# Patient Record
Sex: Female | Born: 1940 | Race: White | Hispanic: No | Smoking: Never smoker
Health system: Southern US, Community
[De-identification: ages and names within clinical notes are randomized; demographics above are authoritative.]

## PROBLEM LIST (undated history)

## (undated) DIAGNOSIS — K219 Gastro-esophageal reflux disease without esophagitis: Secondary | ICD-10-CM

## (undated) DIAGNOSIS — I499 Cardiac arrhythmia, unspecified: Secondary | ICD-10-CM

## (undated) DIAGNOSIS — IMO0002 Reserved for concepts with insufficient information to code with codable children: Secondary | ICD-10-CM

## (undated) DIAGNOSIS — I10 Essential (primary) hypertension: Secondary | ICD-10-CM

## (undated) DIAGNOSIS — D72819 Decreased white blood cell count, unspecified: Secondary | ICD-10-CM

## (undated) DIAGNOSIS — I82402 Acute embolism and thrombosis of unspecified deep veins of left lower extremity: Secondary | ICD-10-CM

## (undated) DIAGNOSIS — M1990 Unspecified osteoarthritis, unspecified site: Secondary | ICD-10-CM

## (undated) DIAGNOSIS — D649 Anemia, unspecified: Secondary | ICD-10-CM

## (undated) DIAGNOSIS — E7800 Pure hypercholesterolemia, unspecified: Secondary | ICD-10-CM

## (undated) DIAGNOSIS — J189 Pneumonia, unspecified organism: Secondary | ICD-10-CM

## (undated) DIAGNOSIS — C55 Malignant neoplasm of uterus, part unspecified: Secondary | ICD-10-CM

## (undated) HISTORY — DX: Cardiac arrhythmia, unspecified: I49.9

## (undated) HISTORY — PX: STAPEDECTOMY: SHX2435

## (undated) HISTORY — PX: APPENDECTOMY: SHX54

## (undated) HISTORY — DX: Malignant neoplasm of uterus, part unspecified: C55

## (undated) HISTORY — DX: Unspecified osteoarthritis, unspecified site: M19.90

## (undated) HISTORY — PX: CHOLECYSTECTOMY: SHX55

## (undated) HISTORY — DX: Decreased white blood cell count, unspecified: D72.819

---

## 1996-10-04 DIAGNOSIS — IMO0001 Reserved for inherently not codable concepts without codable children: Secondary | ICD-10-CM

## 1996-10-04 HISTORY — DX: Reserved for inherently not codable concepts without codable children: IMO0001

## 1996-10-04 HISTORY — PX: TOTAL ABDOMINAL HYSTERECTOMY: SHX209

## 2010-10-04 DIAGNOSIS — J189 Pneumonia, unspecified organism: Secondary | ICD-10-CM

## 2010-10-04 HISTORY — DX: Pneumonia, unspecified organism: J18.9

## 2010-10-14 DIAGNOSIS — Z8542 Personal history of malignant neoplasm of other parts of uterus: Secondary | ICD-10-CM | POA: Insufficient documentation

## 2010-10-15 ENCOUNTER — Ambulatory Visit
Admission: RE | Admit: 2010-10-15 | Discharge: 2010-10-15 | Payer: Self-pay | Source: Home / Self Care | Attending: Critical Care Medicine | Admitting: Critical Care Medicine

## 2010-10-15 DIAGNOSIS — M129 Arthropathy, unspecified: Secondary | ICD-10-CM | POA: Insufficient documentation

## 2010-10-19 ENCOUNTER — Telehealth: Payer: Self-pay | Admitting: Critical Care Medicine

## 2010-10-23 ENCOUNTER — Telehealth: Payer: Self-pay | Admitting: Critical Care Medicine

## 2010-10-27 ENCOUNTER — Telehealth: Payer: Self-pay | Admitting: Critical Care Medicine

## 2010-10-29 ENCOUNTER — Ambulatory Visit: Admit: 2010-10-29 | Payer: Self-pay | Admitting: Critical Care Medicine

## 2010-11-03 ENCOUNTER — Ambulatory Visit
Admission: RE | Admit: 2010-11-03 | Discharge: 2010-11-03 | Payer: Self-pay | Source: Home / Self Care | Attending: Critical Care Medicine | Admitting: Critical Care Medicine

## 2010-11-03 DIAGNOSIS — R918 Other nonspecific abnormal finding of lung field: Secondary | ICD-10-CM

## 2010-11-05 ENCOUNTER — Telehealth: Payer: Self-pay | Admitting: Critical Care Medicine

## 2010-11-05 NOTE — Assessment & Plan Note (Signed)
Summary: Pulmonary Consultation   Copy to:  Dr. Beckey Rutter Primary Provider/Referring Provider:  Dr. Beckey Rutter  CC:  Pulmonary Consult - PNA.Jillian Guerra  History of Present Illness: Pulmonary Consultation 70 yo WF referred for Abn CT. Pt referred for dyspnea.  RML and RLL infilt and LAN in hila.   Pt noted in Dec 2011? viral syndrome   Pt then after 3rd day went to urgent care dx PNA. Pt in [place]  4day admit.  dehydrated.  Pt now  ? s getting better but still with rattle on exam Pt noted symptoms then:  nauseated,  no cough, R sided chest pain,  was dyspneic, noted wheeze,  ?low grade fever,  noted some edema in legs, mucus now is clear  Pt notes since then:  sl dry cough, sl dypsnea,  no chest pain.  no xrays since 09/13/10 never smoked,  pna as a child only no prior lung dz uterine CA. 1998, hysterectomy and no recurrence  Pt noted rx 10days of avelox.  still with abn CT 10/07/10 with RLL RML infilt and LAN in hilum    Preventive Screening-Counseling & Management  Alcohol-Tobacco     Smoking Status: never  Current Medications (verified): 1)  Avelox 400 Mg Tabs (Moxifloxacin Hcl) .... Take 1 Tablet By Mouth Once A Day 2)  Maxzide-25 37.5-25 Mg Tabs (Triamterene-Hctz) .... Take 1 Tablet By Mouth Once A Day 3)  Advil 200 Mg Tabs (Ibuprofen) .... 2 Tablets Three Times A Day  Allergies (verified): No Known Drug Allergies  Past History:  Past medical, surgical, family and social histories (including risk factors) reviewed, and no changes noted (except as noted below).  Past Medical History: ARTHRITIS (ICD-716.90) UTERINE CANCER, HX OF (ICD-V10.42) LEUKOCYTOPENIA, TRANSIENT (ICD-288.50) PNEUMONIA (ICD-486)  Past Surgical History: hysterectomy Appendectomy Cholecystectomy  Family History: Reviewed history and no changes required. sister - DM  Social History: Reviewed history and no changes required. 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  The patient denies shortness of breath with activity, shortness of breath at rest, coughing up blood, chest pain, irregular heartbeats, indigestion, loss of appetite, weight change, difficulty swallowing, sore throat, tooth/dental problems, headaches, sneezing, itching, ear ache, anxiety, depression, rash, change in color of mucus, and fever.    Vital Signs:  Patient profile:   70 year old female Height:      65 inches Weight:      249 pounds BMI:     41.59 O2 Sat:      96 % on Room air Temp:     98.1 degrees F oral Pulse rate:   85 / minute BP sitting:   130 / 60  (right arm) Cuff size:   large  Vitals Entered By: Raymondo Band RN (October 15, 2010 8:50 AM)  O2 Flow:  Room air CC: Pulmonary Consult - PNA. Comments Medications reviewed with patient Daytime contact number verified with patient. Raymondo Band RN  October 15, 2010 8:51 AM    Physical Exam  Additional Exam:  Gen: Pleasant, well-nourished, in no distress,  normal affect ENT: No lesions,  mouth clear,  oropharynx clear, no postnasal drip Neck: No JVD, no TMG, no carotid bruits Lungs: No use of accessory muscles, no dullness  to percussion, dry rales in RML and RLL Cardiovascular: RRR, heart sounds normal, no murmur or gallops, no peripheral edema Abdomen: soft and NT, no HSM,  BS normal Musculoskeletal: No deformities, no cyanosis or clubbing Neuro: alert, non focal Skin: Warm, no lesions or rashes    CT of Chest  Procedure date:  10/07/2010  Findings:      R hilar LAN, extensive airspace dz RLL , RML    Impression & Recommendations:  Problem # 1:  PNEUMONIA (ICD-486) Assessment Unchanged persistent infiltrate RML and RLL ? etiol prob  BOOP plan FOB finish avelox Her updated medication list for this problem includes:    Avelox 400 Mg Tabs (Moxifloxacin hcl) .Jillian Guerra... Take 1 tablet by mouth once a day  Orders: New Patient Level V [id number]  Medications Added to Medication List This Visit: 1)  Avelox 400 Mg Tabs (Moxifloxacin hcl) .... Take 1 tablet by mouth once a day 2)  Advil 200 Mg Tabs (Ibuprofen) .... 2 tablets three times a day  Complete Medication List: 1)  Avelox 400 Mg Tabs (Moxifloxacin hcl) .... Take 1 tablet by mouth once a day 2)  Maxzide-25 37.5-25 Mg Tabs (Triamterene-hctz) .... Take 1 tablet by mouth once a day 3)  Advil 200 Mg Tabs (Ibuprofen) .... 2 tablets three times a day  Patient Instructions: 1)  Finish Avelox  2)  A bronchoscopy will be performed at [facility] on Friday Jan 20th at 130pm , nothing by mouth after 5am on 10/23/10. 3)  Arrive by 1230pm to admitting at [facility]. 4)  Return [place] office on 10/29/10   Immunization History:  Influenza Immunization History:    Influenza:  historical (07/04/2010)  Pneumovax Immunization History:    Pneumovax:  historical (09/03/2010)   Appended Document: Pulmonary Consultation fax Beckey Rutter

## 2010-11-05 NOTE — Progress Notes (Signed)
Summary: Bronch rescheduled to 11/03/10  Phone Note Outgoing Call   Call placed by: Raymondo Band RN,  October 27, 2010 11:30AM Call placed to: Patient Summary of Call: Bronch was supposed to be scheduled today at 1:30 at [facility].  However, due to scheduling error pt was not placed on Bronch schedule.  Per Dr. Joya Gaskins, the next time he can do this would be 11/03/10 at [facility].  Called Resp[facility], spoke with [place]. FOB was scheduled with fluro for 11/03/10 at 11:30am.  Pt needs to arrive at 10:15.  Pt and her daughter are both aware of the scheduling error.  Tammy D and I both appologized for this inconvience.  They were both informed this will be done on 11/03/10 at 11:30am at [facility] but pt needs to arrive at 10:15am.  To follow same instructions as before.  They both verbalized understanding of this above. Initial call taken by: Raymondo Band RN,  October 27, 2010 5:19 PM

## 2010-11-05 NOTE — Progress Notes (Signed)
Summary: FOB reschedule  Phone Note Outgoing Call   Reason for Call: Confirm/change Appt Summary of Call: fob needs to be rescheduled  i offered mon or tues at 130p or Fri at 10 or 11am next week she is to call back and give [place] a day  Pt states she wants to schedule her bronch for Tues at 1:30 she can be reached at [phone].Jillian Guerra  October 23, 2010 4:20 PM   Initial call taken by: Elsie Stain MD,  October 23, 2010 4:11 PM  Follow-up for Phone Call        noted Follow-up by: Elsie Stain MD,  October 23, 2010 4:57 PM

## 2010-11-05 NOTE — Progress Notes (Signed)
Summary: Bronch rescheduled  Phone Note Other Incoming   Caller: Dr. Joya Gaskins Summary of Call: Received call from Dr. Joya Gaskins.  states pt has a bronch scheduled for this Friday at 1:30 at [facility]  However, he is needing this rescheduled.  ? if pt can do this Thursday at 7:30am at [facility].  If not, will have to wait until next week.  Once confirmation from pt, will need to call Resp to set this up.  Called, spoke with pt's daughter, Earnest Bailey.  States pt is unavailable at this time.  She will have pt return my call.  Pt returning call.Netta Neat  October 19, 2010 4:44 PM  Initial call taken by: Raymondo Band RN,  October 19, 2010 4:38 PM  Follow-up for Phone Call        Pt returned call.  She was informed bronch scheduled for this Friday needs to be rescheduled.  She will call back tomorrow to let me know if this Thursday am will work.  If not, she will let me know days next wk that will work for her.  Will await pt's  call back  Patient calling back. Mateo Flow  October 20, 2010 9:54 AM Follow-up by: Raymondo Band RN,  October 19, 2010 5:00 PM  Additional Follow-up for Phone Call Additional follow up Details #1::        Spoke with pt.  States this Thursday at 7:30am at [facility] is ok for bronch.  Pt aware to arrive at 6:15am.  Called Resp, spoke with Sharyn Lull.  Bronch was rescheduled from Friday at 1:30 to this Thursday, Jan 19 at 7:30am - [facility].  Will forward message to PW so he is aware of change. Additional Follow-up by: Raymondo Band RN,  October 20, 2010 10:56 AM     Appended Document: Bronch rescheduled noted  pw

## 2010-11-06 LAB — CULTURE, RESPIRATORY W GRAM STAIN

## 2010-11-08 LAB — LEGIONELLA PROFILE(CULTURE+DFA/SMEAR): Legionella Antigen (DFA): NEGATIVE

## 2010-11-10 ENCOUNTER — Encounter: Payer: Self-pay | Admitting: Critical Care Medicine

## 2010-11-10 ENCOUNTER — Ambulatory Visit (INDEPENDENT_AMBULATORY_CARE_PROVIDER_SITE_OTHER): Payer: MEDICARE | Admitting: Critical Care Medicine

## 2010-11-10 ENCOUNTER — Ambulatory Visit: Payer: Self-pay | Admitting: Critical Care Medicine

## 2010-11-10 DIAGNOSIS — J8409 Other alveolar and parieto-alveolar conditions: Secondary | ICD-10-CM | POA: Insufficient documentation

## 2010-11-11 NOTE — Op Note (Signed)
  [name], [name]                   ACCOUNT NO.:  [id number]  MEDICAL RECORD NO.:  [id number]          PATIENT TYPE:  AMB  LOCATION:  CARD                         FACILITY:  [facility]  PHYSICIAN:  Burnett Harry. Joya Gaskins, MD, FCCPDATE OF BIRTH:  November 25, 1940  DATE OF PROCEDURE:  11/03/2010 DATE OF DISCHARGE:                              OPERATIVE REPORT   CHIEF COMPLAINT:  Chronic infiltrate right lower lobe, evaluate for cause.  SURGEON:  Asencion Noble, M.D.  ANESTHESIA:  1% Xylocaine local.  PREOPERATIVE MEDICATIONS: 1. Versed 4 mg. 2. Fentanyl 30 mcg IV push.  PROCEDURE:  The Pentax video bronchoscope was introduced through the right naris.  The upper airways were visualized and were unremarkable. The entire tracheobronchial tree was visualized and revealed purulence in the right lower lobe orifice.  No endobronchial lesions were seen. No other purulence was seen.  Attention was then paid to the right lower lobe.  Transbronchial biopsies were obtained x6, also bronchial washings prior to the biopsies were obtained.  COMPLICATIONS:  None.  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  RECOMMENDATIONS:  Follow up pathology and microbiology.     Burnett Harry Joya Gaskins, MD, [facility]     PEW/MEDQ  D:  11/03/2010  T:  11/03/2010  Job:  [id number]  cc:   Dr. Beckey Rutter  Electronically Signed by Asencion Noble MD FCCP on 11/11/2010 02:35:20 PM

## 2010-11-11 NOTE — Progress Notes (Signed)
Summary: FOB results and need OV f/u  Phone Note Outgoing Call   Reason for Call: Confirm/change Appt, Discuss lab or test results Summary of Call: Pt needs an ov to discuss test results. Tell her bronch shows bronchiolitis obliterans organized pneumonia she needs pulse prednisone.  I sent this RX electronically to her pharmacy Initial call taken by: Elsie Stain MD,  November 05, 2010 2:38 PM  Follow-up for Phone Call        pt returned call. Cooper Render, CNA  November 06, 2010 10:11 AM  Additional Follow-up for Phone Call Additional follow up Details #1::        done see earlier comment  please set up ov  Additional Follow-up by: Elsie Stain MD,  November 06, 2010 12:07 PM    Additional Follow-up for Phone Call Additional follow up Details #2::    Called, spoke with pt.  States she has already spoken with PW and aware pred rx sent.  OV scheduled for 11/12/10 at 2:45 in [place] -- pt aware. Follow-up by: Raymondo Band RN,  November 06, 2010 12:13 PM  New/Updated Medications: PREDNISONE 10 MG  TABS (PREDNISONE) Take as directed 4 each am x 4 days, 3 x 4 days, 2 x 4 days, then one daily and stay Prescriptions: PREDNISONE 10 MG  TABS (PREDNISONE) Take as directed 4 each am x 4 days, 3 x 4 days, 2 x 4 days, then one daily and stay  #60 x 3   Entered and Authorized by:   Elsie Stain MD   Signed by:   Elsie Stain MD on 11/05/2010   Method used:   Electronically to        [facility]  [phone] (retail)       [place], [place]  [address]       Ph: [phone] or [phone]       Fax: [phone]   RxID:   [id number]

## 2010-11-12 ENCOUNTER — Ambulatory Visit: Payer: Self-pay | Admitting: Critical Care Medicine

## 2010-11-19 NOTE — Assessment & Plan Note (Addendum)
Summary: Pulmonary OV   Copy to:  Dr. Beckey Rutter Primary Provider/Referring Provider:  Dr. Beckey Rutter  CC:  Follow up to discuss bronch results.  Pt states breathing is better.  SOB only when going up stairs.  A little wheezing and cough - rarely prod with clear mucus..  History of Present Illness: Pulmonary Consultation 70 yo WF referred for Abn CT. Pt referred for dyspnea.  RML and RLL infilt and LAN in hila.   Pt noted in Dec 2011? viral syndrome   Pt then after 3rd day went to urgent care dx PNA. Pt in [place]  4day admit.  dehydrated.  Pt now  ? s getting better but still with rattle on exam Pt noted symptoms then:  nauseated,  no cough, R sided chest pain,  was dyspneic, noted wheeze,  ?low grade fever,  noted some edema in legs, mucus now is clear  Pt notes since then:  sl dry cough, sl dypsnea,  no chest pain.  no xrays since 09/13/10 never smoked,  pna as a child only no prior lung dz uterine CA. 1998, hysterectomy and no recurrence  Pt noted rx 10days of avelox.  still with abn CT 10/07/10 with RLL RML infilt and LAN in hilum  November 10, 2010 11:33 AM now is better,  now some cough,  cough is prod of clear mucus. 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 Take 1 Tablet By Mouth Once A Day 2)  Advil 200 Mg Tabs (Ibuprofen) .... 2 Tablets Three Times A Day 3)  Prednisone 10 Mg  Tabs (Prednisone) .... Take As Directed 4 Each Am X 4 Days, 3 X 4 Days, 2 X 4 Days, Then One Daily and Stay  Allergies (verified): No Known Drug Allergies  Past History:  Past medical, surgical, family and social histories (including risk factors) reviewed, and no changes noted (except as noted below).  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 hysterectomy Appendectomy Cholecystectomy  Family History: Reviewed history from 10/15/2010 and no changes required. sister - DM  Social History: Reviewed history from 10/15/2010 and no changes required. 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  The patient denies shortness of breath at rest, productive cough, coughing up blood, chest pain, irregular heartbeats, acid heartburn, indigestion, loss of appetite, weight change, abdominal pain, difficulty swallowing, sore throat, tooth/dental problems, headaches, nasal congestion/difficulty breathing through nose, sneezing, itching, ear ache, anxiety, depression, hand/feet swelling, joint stiffness or pain, rash, change in color of mucus, and fever.    Vital Signs:  Patient profile:   70 year old female Height:      65 inches Weight:      249.25 pounds BMI:     41.63 O2 Sat:      97 % on Room air Temp:  97.6 degrees F oral Pulse rate:   79 / minute BP sitting:   136 / 78  (left arm) Cuff size:   large  Vitals Entered By: Raymondo Band RN (November 10, 2010 11:20 AM)  O2 Flow:  Room air CC: Follow up to discuss bronch results.  Pt states breathing is better.  SOB only when going up stairs.  A little wheezing, cough - rarely prod with clear mucus. Comments Medications reviewed with patient Daytime contact number verified with patient. Raymondo Band RN  November 10, 2010 11:20 AM    Physical Exam  Additional Exam:  Gen: Pleasant, well-nourished, in no distress,  normal affect ENT: No lesions,  mouth clear,  oropharynx clear, no postnasal drip Neck: No JVD, no TMG, no carotid bruits Lungs:  No use of accessory muscles, no dullness to percussion, dry rales in RML and RLL Cardiovascular: RRR, heart sounds normal, no murmur or gallops, no peripheral edema Abdomen: soft and NT, no HSM,  BS normal Musculoskeletal: No deformities, no cyanosis or clubbing Neuro: alert, non focal Skin: Warm, no lesions or rashes    Impression & Recommendations:  Problem # 1:  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  plan prednisone pulse and taper rov 4 weeks  Complete Medication List: 1)  Maxzide-25 37.5-25 Mg Tabs (Triamterene-hctz) .... Take 1 tablet by mouth once a day 2)  Advil 200 Mg Tabs (Ibuprofen) .... 2 tablets three times a day 3)  Prednisone 10 Mg Tabs (Prednisone) .... Take as directed 4 each am x 4 days, 3 x 4 days, 2 x 4 days, then one daily and stay  Other Orders: Est. Patient Level IV [id number])  Patient Instructions: 1)  When you taper to one daily on prednisone and get refilled, stay on one daily 10mg . 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 3)  Return 4 weeks with chest xray    Appended Document: Pulmonary OV fax Beckey Rutter

## 2010-11-30 LAB — FUNGUS CULTURE W SMEAR

## 2010-12-07 ENCOUNTER — Telehealth (INDEPENDENT_AMBULATORY_CARE_PROVIDER_SITE_OTHER): Payer: Self-pay | Admitting: *Deleted

## 2010-12-15 NOTE — Progress Notes (Signed)
Summary: cxr prior to appt   Phone Note Call from Patient Call back at Home Phone [phone]   Caller: Patient Call For: Jillian Guerra Summary of Call: patient phoned she has an appointment on Friday 3/16 and is suposed to have a chest xray she wants to know if she should have it prior to the appointment or after. She can be reached at [phone] Initial call taken by: Ozella Rocks,  December 07, 2010 4:09 PM  Follow-up for Phone Call        pt last saw PW on 11-10-2010.  pt instructions state to f/u in 4 week with cxr.  Pt scheduled to see PW on 12-18-2010 at 3:45pm.  need to inform pt to arrive a few mins early to have cxr prior to appt.  [id number].  [name] LPN  March  5, [id number] PM   Called and spoke with pt and informed her she needs to arrive 10-15 minutes prior to apt time to have cxr 1st. Pt verbalized understanding and had no furthur questions [name]  December 08, 2010 4:54 PM

## 2010-12-16 LAB — AFB CULTURE WITH SMEAR (NOT AT ARMC): Acid Fast Smear: NONE SEEN

## 2010-12-18 ENCOUNTER — Encounter: Payer: Self-pay | Admitting: Critical Care Medicine

## 2010-12-18 ENCOUNTER — Ambulatory Visit (INDEPENDENT_AMBULATORY_CARE_PROVIDER_SITE_OTHER)
Admission: RE | Admit: 2010-12-18 | Discharge: 2010-12-18 | Disposition: A | Discharge status: Home / Self Care | Payer: MEDICARE | Source: Ambulatory Visit | Attending: Critical Care Medicine | Admitting: Critical Care Medicine

## 2010-12-18 ENCOUNTER — Other Ambulatory Visit: Payer: Self-pay | Admitting: Critical Care Medicine

## 2010-12-18 ENCOUNTER — Ambulatory Visit (INDEPENDENT_AMBULATORY_CARE_PROVIDER_SITE_OTHER): Payer: MEDICARE | Admitting: Critical Care Medicine

## 2010-12-18 DIAGNOSIS — J8409 Other alveolar and parieto-alveolar conditions: Secondary | ICD-10-CM

## 2010-12-22 NOTE — Assessment & Plan Note (Addendum)
Summary: Pulmonary OV   Copy to:  Dr. Beckey Rutter Primary Provider/Referring Provider:  Dr. Beckey Rutter  CC:  4 wk follow up.  Pt states breathing has improved.  Denies SOB, wheezing, chest tightness, and cough.  .  History of Present Illness: Pulmonary Consultation 70 yo WF referred for Abn CT. Pt referred for dyspnea.  RML and RLL infilt and LAN in hila.   Pt noted in Dec 2011? viral syndrome   Pt then after 3rd day went to urgent care dx PNA. Pt in [place]  4day admit.  dehydrated.  Pt now  ? s getting better but still with rattle on exam Pt noted symptoms then:  nauseated,  no cough, R sided chest pain,  was dyspneic, noted wheeze,  ?low grade fever,  noted some edema in legs, mucus now is clear  Pt notes since then:  sl dry cough, sl dypsnea,  no chest pain.  no xrays since 09/13/10 never smoked,  pna as a child only no prior lung dz uterine CA. 1998, hysterectomy and no recurrence  Pt noted rx 10days of avelox.  still with abn CT 10/07/10 with RLL RML infilt and LAN in hilum  November 10, 2010 11:33 AM now is better,  now some cough,  cough is prod of clear mucus. 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  no new issues Pt denies any significant sore throat, nasal congestion or excess secretions, fever, chills, sweats, unintended weight loss, pleurtic or exertional chest pain, orthopnea PND, or leg swelling Pt denies any increase in rescue therapy over baseline, denies waking up needing it or having any early am or nocturnal exacerbations of coughing/wheezing/or dyspnea.   Current Medications (verified): 1)  Maxzide-25 37.5-25 Mg Tabs (Triamterene-Hctz) .... Take 1 Tablet By Mouth Once A Day 2)  Advil 200 Mg Tabs (Ibuprofen) .... 2 Tablets Three Times  A  As Needed 3)  Prednisone 10 Mg  Tabs (Prednisone) .... Take 1 Tablet By Mouth Once A Day  Allergies (verified): No Known Drug Allergies  Past History:  Past medical, surgical, family and social histories (including risk factors) reviewed, and no changes noted (except as noted below).  Past Medical History: Reviewed history from 11/10/2010 and no changes required. 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 hysterectomy Appendectomy Cholecystectomy  Family History: Reviewed history from 10/15/2010 and no changes required. sister - DM  Social History: Reviewed history from 10/15/2010 and no changes required. 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  Vital Signs:  Patient profile:   70 year old female Height:      65 inches Weight:      246.38 pounds BMI:     41.15 O2 Sat:      96 % on Room air Temp:     98.2 degrees F oral Pulse rate:   92 / minute BP sitting:   110 / 70  (left arm) Cuff size:   large  Vitals Entered By: Raymondo Band RN (December 18, 2010 3:41 PM)  O2 Flow:  Room air CC: 4 wk follow up.  Pt states breathing has improved.  Denies SOB, wheezing, chest tightness, cough.   Comments Medications reviewed  with patient Daytime contact number verified with patient. Raymondo Band RN  December 18, 2010 3:41 PM    Physical Exam  Additional Exam:  Gen: Pleasant, well-nourished, in no distress,  normal affect ENT: No lesions,  mouth clear,  oropharynx clear, no postnasal drip Neck: No JVD, no TMG, no carotid bruits Lungs: No use of accessory muscles, no dullness to percussion, clear Cardiovascular: RRR, heart sounds normal, no murmur or gallops, no peripheral edema Abdomen: soft and NT, no HSM,  BS normal Musculoskeletal: No deformities, no cyanosis or clubbing Neuro: alert, non focal Skin: Warm, no lesions or rashes    Impression & Recommendations:  Problem # 1:  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 2 tablets three times a  as needed 2)  Prednisone 10 Mg Tabs (Prednisone) .... Take 1 tablet by mouth once a day 3)  Prednisone 10 Mg Tabs (Prednisone) .... Reduce to one every other day for 7 days then one three times a week for 7days then stop  Complete Medication List: 1)  Maxzide-25 37.5-25 Mg Tabs (Triamterene-hctz) .... Take 1 tablet by mouth once a day 2)  Advil 200 Mg Tabs (Ibuprofen) .... 2 tablets three times a  as needed 3)  Prednisone 10 Mg Tabs (Prednisone) .... Reduce to one every other day for 7 days then one three times a week for 7days then stop  Other Orders: T-2 View CXR ([id number]) Est. Patient Level III [id number])  Patient Instructions: 1)  Prednisone 10mg   Reduce to one every other day for 7days, then one three times a day for 7days then stop 2)  Return 3 months   Appended Document: Pulmonary OV fax Beckey Rutter

## 2011-03-29 ENCOUNTER — Telehealth: Payer: Self-pay | Admitting: Critical Care Medicine

## 2011-03-29 NOTE — Telephone Encounter (Signed)
I spoke with Cammie and she wanted to know was an authorization done on pt bronch by our office. I advised per [facility] that our [facility] does not do auth for bronch.[place] Bing, CMA

## 2011-04-14 ENCOUNTER — Ambulatory Visit: Payer: Self-pay | Admitting: Critical Care Medicine

## 2011-04-26 ENCOUNTER — Ambulatory Visit: Payer: Self-pay | Admitting: Critical Care Medicine

## 2011-04-27 ENCOUNTER — Encounter: Payer: Self-pay | Admitting: Critical Care Medicine

## 2011-04-29 ENCOUNTER — Ambulatory Visit (INDEPENDENT_AMBULATORY_CARE_PROVIDER_SITE_OTHER): Payer: Medicare Other | Admitting: Critical Care Medicine

## 2011-04-29 ENCOUNTER — Encounter: Payer: Self-pay | Admitting: Critical Care Medicine

## 2011-04-29 VITALS — BP 140/68 | HR 86 | Temp 98.3°F | Ht 66.0 in | Wt 239.0 lb

## 2011-04-29 DIAGNOSIS — J8409 Other alveolar and parieto-alveolar conditions: Secondary | ICD-10-CM

## 2011-04-29 DIAGNOSIS — I82409 Acute embolism and thrombosis of unspecified deep veins of unspecified lower extremity: Secondary | ICD-10-CM

## 2011-04-29 NOTE — Patient Instructions (Signed)
No change in medications. Return as needed 

## 2011-04-29 NOTE — Progress Notes (Signed)
Subjective:    Patient ID: Jillian Guerra, female    DOB: [birth year], 70 y.o.   MRN: [id number]  HPI 70 y.o.   WF referred for Abn CT.  Pt referred for dyspnea. RML and RLL infilt and LAN in [place].  Pt noted in Dec 2011? viral syndrome Pt then after 3rd day went to urgent care dx PNA.  Pt in [place] 4day admit. dehydrated. Pt now ? s getting better but still with rattle on exam  Pt noted symptoms then: nauseated, no cough, R sided chest pain, was dyspneic, noted wheeze, ?low grade fever, noted some edema in legs, mucus now is clear  Pt notes since then: sl dry cough, sl dypsnea, no chest pain. no xrays since 09/13/10  never smoked, pna as a child only  no prior lung dz  uterine CA. 1998, hysterectomy and no recurrence  Pt noted rx 10days of avelox. still with abn CT 10/07/10 with RLL RML infilt and LAN in [place]   November 10, 2010 11:33 AM  now is better, now some cough, cough is prod of clear mucus.  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 no new issues  Pt denies any significant sore throat, nasal congestion or excess secretions, fever, chills, sweats, unintended weight loss, pleurtic or exertional chest pain, orthopnea PND, or leg swelling  Pt denies any increase in rescue therapy over baseline, denies waking up needing it or having any early am or nocturnal exacerbations of coughing/wheezing/or dyspnea.   04/29/2011 Since last OV had DVT L leg.  Second tow, clipped with clippers and got infected.  6/12 hosp for 5days, unable to walk d/t leg edema Dyspnea is better.  No chest pain or cough.  No f/c/s.    Review of Systems Constitutional:   No  weight loss, night sweats,  Fevers, chills, fatigue, lassitude. HEENT:   No headaches,  Difficulty swallowing,   Tooth/dental problems,  Sore throat,                No sneezing, itching, ear ache, nasal congestion, post nasal drip,   CV:  No chest pain,  Orthopnea, PND, swelling in lower extremities, anasarca, dizziness, palpitations  GI  No heartburn, indigestion, abdominal pain, nausea, vomiting, diarrhea, change in bowel habits, loss of appetite  Resp: No shortness of breath with exertion or at rest.  No excess mucus, no productive cough,  No non-productive cough,  No coughing up of blood.  No change in color of mucus.  No wheezing.  No chest wall deformity  Skin: no rash or lesions.  GU: no dysuria, change in color of urine, no urgency or frequency.  No flank pain.  MS:  No joint pain or swelling.  No decreased range of motion.  No back pain.  Psych:  No change in mood or affect. No depression or anxiety.  No memory loss.     Objective:   Physical Exam Filed Vitals:   04/29/11 1341  BP: 140/68  Pulse: 86  Temp: 98.3 F (36.8 C)  TempSrc: Oral  Height: 5\' 6"  (1.676 m)  Weight: 239 lb (108.41 kg)  SpO2: 99%    Gen: Pleasant,  well-nourished, in no distress,  normal affect  ENT: No lesions,  mouth clear,  oropharynx clear, no postnasal drip  Neck: No JVD, no TMG, no carotid bruits  Lungs: No use of accessory muscles, no dullness to percussion, clear without rales or rhonchi  Cardiovascular: RRR, heart sounds normal, no murmur or gallops, no peripheral edema  Abdomen: soft and NT, no HSM,  BS normal  Musculoskeletal: No deformities, no cyanosis or clubbing  Neuro: alert, non focal  Skin: Warm, no lesions or rashes     3/12 IMPRESSION:  Slightly prominent markings in the right mid upper lung field may  represent residual pneumonia. Recommend continued follow-up.    Assessment & Plan:   OTH SPEC ALVEOL&PARIETOALVEOL PNEUMONOPATHIES No new problems BOOP resolved Plan F/u prn     Updated Medication List Outpatient Encounter Prescriptions as of 04/29/2011    Medication Sig Dispense Refill  . acetaminophen (TYLENOL) 325 MG tablet Take 650 mg by mouth every 6 (six) hours as needed.        . simvastatin (ZOCOR) 20 MG tablet Take 1 tablet by mouth Daily.      Marland Kitchen triamterene-hydrochlorothiazide (MAXZIDE-25) 37.5-25 MG per tablet Take 1 tablet by mouth daily.        Marland Kitchen warfarin (COUMADIN) 5 MG tablet Take as directed      . DISCONTD: ibuprofen (ADVIL,MOTRIN) 200 MG tablet Take 400 mg by mouth 3 (three) times daily as needed.        Marland Kitchen DISCONTD: predniSONE (DELTASONE) 10 MG tablet as directed.

## 2011-05-01 NOTE — Assessment & Plan Note (Signed)
No new problems BOOP resolved Plan F/u prn

## 2012-06-27 ENCOUNTER — Other Ambulatory Visit: Payer: Self-pay | Admitting: Otolaryngology

## 2013-04-11 ENCOUNTER — Other Ambulatory Visit: Payer: Self-pay | Admitting: Urology

## 2013-04-19 ENCOUNTER — Other Ambulatory Visit (HOSPITAL_COMMUNITY): Payer: Self-pay | Admitting: Urology

## 2013-04-19 DIAGNOSIS — N289 Disorder of kidney and ureter, unspecified: Secondary | ICD-10-CM

## 2013-04-30 ENCOUNTER — Encounter (HOSPITAL_COMMUNITY): Payer: Self-pay | Admitting: Pharmacy Technician

## 2013-05-02 ENCOUNTER — Encounter (HOSPITAL_COMMUNITY)
Admission: RE | Admit: 2013-05-02 | Discharge: 2013-05-02 | Disposition: A | Discharge status: Home / Self Care | Payer: Medicare Other | Source: Ambulatory Visit | Attending: Urology | Admitting: Urology

## 2013-05-02 ENCOUNTER — Encounter (HOSPITAL_COMMUNITY): Payer: Self-pay

## 2013-05-02 DIAGNOSIS — N289 Disorder of kidney and ureter, unspecified: Secondary | ICD-10-CM | POA: Insufficient documentation

## 2013-05-02 MED ORDER — FUROSEMIDE 10 MG/ML IJ SOLN
60.0000 mg | Freq: Once | INTRAMUSCULAR | Status: AC
  Administered 2013-05-02: 54 mg via INTRAVENOUS
  Filled 2013-05-02: qty 6

## 2013-05-02 MED ORDER — TECHNETIUM TC 99M MERTIATIDE
15.0000 | Freq: Once | INTRAVENOUS | Status: AC | PRN
  Administered 2013-05-02: 15 via INTRAVENOUS

## 2013-05-03 NOTE — Patient Instructions (Addendum)
[place]  05/03/2013   Your procedure is scheduled on: 05/08/13  Report to [facility] at 8:30 AM.  Call this number if you have problems the morning of surgery [phone]: [phone]   Remember:   Do not eat food or drink liquids After Midnight.     Take these medicines the morning of surgery with A SIP OF WATER: pepcid if needed   Do not wear jewelry, make-up or nail polish.  Do not wear lotions, powders, or perfumes. You may wear deodorant.  Do not shave 48 hours prior to surgery. Men may shave face and neck.  Do not bring valuables to the hospital.  Contacts, dentures or bridgework may not be worn into surgery.     Patients discharged the day of surgery will not be allowed to drive home.  Name and phone number of your driver: Ebony Hail [phone]    Paulette Blanch, RN  pre op nurse call if needed [phone]    FAILURE TO FOLLOW THESE INSTRUCTIONS MAY RESULT IN CANCELLATION OF YOUR SURGERY   Patient Signature: ___________________________________________

## 2013-05-04 ENCOUNTER — Encounter (HOSPITAL_COMMUNITY): Payer: Self-pay

## 2013-05-04 ENCOUNTER — Encounter (HOSPITAL_COMMUNITY)
Admission: RE | Admit: 2013-05-04 | Discharge: 2013-05-04 | Disposition: A | Discharge status: Home / Self Care | Payer: Medicare Other | Source: Ambulatory Visit | Attending: Urology | Admitting: Urology

## 2013-05-04 ENCOUNTER — Ambulatory Visit (HOSPITAL_COMMUNITY)
Admission: RE | Admit: 2013-05-04 | Discharge: 2013-05-04 | Disposition: A | Discharge status: Home / Self Care | Payer: Medicare Other | Source: Ambulatory Visit | Attending: Urology | Admitting: Urology

## 2013-05-04 DIAGNOSIS — N1330 Unspecified hydronephrosis: Secondary | ICD-10-CM | POA: Insufficient documentation

## 2013-05-04 DIAGNOSIS — Z01812 Encounter for preprocedural laboratory examination: Secondary | ICD-10-CM | POA: Insufficient documentation

## 2013-05-04 DIAGNOSIS — N3289 Other specified disorders of bladder: Secondary | ICD-10-CM | POA: Insufficient documentation

## 2013-05-04 DIAGNOSIS — Z01818 Encounter for other preprocedural examination: Secondary | ICD-10-CM | POA: Insufficient documentation

## 2013-05-04 HISTORY — DX: Anemia, unspecified: D64.9

## 2013-05-04 HISTORY — DX: Pneumonia, unspecified organism: J18.9

## 2013-05-04 HISTORY — DX: Essential (primary) hypertension: I10

## 2013-05-04 HISTORY — DX: Gastro-esophageal reflux disease without esophagitis: K21.9

## 2013-05-04 HISTORY — DX: Acute embolism and thrombosis of unspecified deep veins of left lower extremity: I82.402

## 2013-05-04 HISTORY — DX: Reserved for concepts with insufficient information to code with codable children: IMO0002

## 2013-05-04 HISTORY — DX: Pure hypercholesterolemia, unspecified: E78.00

## 2013-05-04 LAB — CBC
HCT: 33.1 % — ABNORMAL LOW (ref 36.0–46.0)
Hemoglobin: 10.7 g/dL — ABNORMAL LOW (ref 12.0–15.0)
MCH: 29.6 pg (ref 26.0–34.0)
MCHC: 32.3 g/dL (ref 30.0–36.0)
MCV: 91.7 fL (ref 78.0–100.0)
Platelets: 279 10*3/uL (ref 150–400)
RBC: 3.61 MIL/uL — ABNORMAL LOW (ref 3.87–5.11)
RDW: 14 % (ref 11.5–15.5)
WBC: 8.6 10*3/uL (ref 4.0–10.5)

## 2013-05-04 LAB — BASIC METABOLIC PANEL
BUN: 37 mg/dL — ABNORMAL HIGH (ref 6–23)
CO2: 27 mEq/L (ref 19–32)
Calcium: 9.8 mg/dL (ref 8.4–10.5)
Chloride: 101 mEq/L (ref 96–112)
Creatinine, Ser: 2.31 mg/dL — ABNORMAL HIGH (ref 0.50–1.10)
GFR calc Af Amer: 23 mL/min — ABNORMAL LOW (ref >90)
GFR calc non Af Amer: 20 mL/min — ABNORMAL LOW (ref >90)
Glucose, Bld: 108 mg/dL — ABNORMAL HIGH (ref 70–99)
Potassium: 4.8 mEq/L (ref 3.5–5.1)
Sodium: 138 mEq/L (ref 135–145)

## 2013-05-04 NOTE — Progress Notes (Signed)
EKG 05/18/12 on chart

## 2013-05-04 NOTE — Progress Notes (Signed)
05/04/13 0928  OBSTRUCTIVE SLEEP APNEA  Have you ever been diagnosed with sleep apnea through a sleep study? No  Do you snore loudly (loud enough to be heard through closed doors)?  0  Do you often feel tired, fatigued, or sleepy during the daytime? 1  Has anyone observed you stop breathing during your sleep? 0  Do you have, or are you being treated for high blood pressure? 1  BMI more than 35 kg/m2? 1  Age over 71 years old? 1  Neck circumference greater than 40 cm/18 inches? 0  Gender: 0  Obstructive Sleep Apnea Score 4  Score 4 or greater  Results sent to PCP

## 2013-05-07 NOTE — H&P (Signed)
H&P   History of Present Illness:  Patient has been evaluated in the office for gross hematuria, incomplete bladder emptying, hydronephrosis, bladder neoplasm, Recurrent UTI.  She was seen in the office: -Jul 2014 renal ultrasound showed bilateral hydroureteronephrosis and a post Void of 334 ml Foley placed -Jul 2014 Cystoscopy revealed neoplastic changes at the dome of the bladder but these may be related to radiation.  -Jul 2014 CT  A/P noncontrast with foley - no stones. The hydronephrosis was much improved with moderate pelviectasis on the left and some stranding around the left pelvis.  -Jul 2014 Failed void trial foley replaced later in the day -Jul 2014 MAG3 with Lasix was done because of increased creatinine ( which interestingly increased after Foley placement and hydro decreased) was consistent with medical renal disease.   She presents today with further evaluation for cystoscopy, bladder biopsy, retrograde pyelograms, possible ureteroscopy. She has been well with no fever or bladder pain.   Patient has a history uterine cancer and total hysterectomy in 1998 followed by pelvic radiation. She is not sexually active. About 6 years ago, she has an episode of urinary retention where she couldnt void. She went to the ER and had a foley placed. The foley was removed in PCP office  Labs February 2014: BUN 33, creatinine 1.66, GFR 31-36. October 2013: BUN 31, creatinine 1.76, GFR 29-33   Past Medical History  Diagnosis Date  . Arthritis   . Uterine cancer   . Leukocytopenia     transient  . Hypertension   . Deep vein blood clot of left lower extremity   . 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 Hypercholesteremia   . GERD (gastroesophageal reflux disease)   . Radiation 1998    hx of  . Anemia    Past Surgical History  Procedure Laterality Date  . Appendectomy    . Cholecystectomy    . Total abdominal hysterectomy  1998  . Stapedectomy Bilateral [id number]    Home Medications:  No  prescriptions prior to admission   Allergies: No Known Allergies  Family History  Problem Relation Age of Onset  . Diabetes Sister    Social History:  reports that she has never smoked. She has never used smokeless tobacco. She reports that she does not drink alcohol or use illicit drugs.  ROS: A complete review of systems was performed.  All systems are negative except for pertinent findings as noted. @ROS @   Physical Exam:  Vital signs in last 24 hours:   General:  Alert and oriented, No acute distress HEENT: Normocephalic, atraumatic Neck: No JVD or lymphadenopathy Cardiovascular: Regular rate and rhythm Lungs: Regular rate and effort Abdomen: Soft, nontender, nondistended, no abdominal masses Back: No CVA tenderness Extremities: No edema Neurologic: Grossly intact  Laboratory Data:  No results found for this or any previous visit (from the past 24 hour(s)). No results found for this or any previous visit (from the past 240 hour(s)). Creatinine:  Recent Labs  05/04/13 1000  CREATININE 2.31*    Impression/Assessment:  UTI Bladder neoplasm Hydronephrosis Incomplete bladder emptying Gross hematuria  Plan: I discussed with the patient the nature, potential benefits, risks and alternatives to cystoscopy, bladder biopsy, retrograde pyelograms, possible ureteroscopy/stent, including side effects of the proposed treatment, the likelihood of the patient achieving the goals of the procedure, and any potential problems that might occur during the procedure or recuperation. All questions answered. Patient elects to proceed.   Pending results I plan to get VUDS  in office.   Gent and Ancef for Abx. Pt off ASA.    Fredricka Bonine

## 2013-05-08 ENCOUNTER — Encounter (HOSPITAL_COMMUNITY): Payer: Self-pay | Admitting: *Deleted

## 2013-05-08 ENCOUNTER — Ambulatory Visit (HOSPITAL_COMMUNITY): Payer: Medicare Other | Admitting: Anesthesiology

## 2013-05-08 ENCOUNTER — Encounter (HOSPITAL_COMMUNITY)
Admission: RE | Disposition: A | Discharge status: Home / Self Care | Payer: Self-pay | Source: Ambulatory Visit | Attending: Urology

## 2013-05-08 ENCOUNTER — Ambulatory Visit (HOSPITAL_COMMUNITY)
Admission: RE | Admit: 2013-05-08 | Discharge: 2013-05-08 | Disposition: A | Discharge status: Home / Self Care | Payer: Medicare Other | Source: Ambulatory Visit | Attending: Urology | Admitting: Urology

## 2013-05-08 ENCOUNTER — Encounter (HOSPITAL_COMMUNITY): Payer: Self-pay | Admitting: Anesthesiology

## 2013-05-08 DIAGNOSIS — E669 Obesity, unspecified: Secondary | ICD-10-CM | POA: Insufficient documentation

## 2013-05-08 DIAGNOSIS — R339 Retention of urine, unspecified: Secondary | ICD-10-CM | POA: Insufficient documentation

## 2013-05-08 DIAGNOSIS — R310 Gross hematuria: Secondary | ICD-10-CM | POA: Insufficient documentation

## 2013-05-08 DIAGNOSIS — I129 Hypertensive chronic kidney disease with stage 1 through stage 4 chronic kidney disease, or unspecified chronic kidney disease: Secondary | ICD-10-CM | POA: Insufficient documentation

## 2013-05-08 DIAGNOSIS — N1330 Unspecified hydronephrosis: Secondary | ICD-10-CM | POA: Insufficient documentation

## 2013-05-08 DIAGNOSIS — N189 Chronic kidney disease, unspecified: Secondary | ICD-10-CM | POA: Insufficient documentation

## 2013-05-08 DIAGNOSIS — N135 Crossing vessel and stricture of ureter without hydronephrosis: Secondary | ICD-10-CM | POA: Insufficient documentation

## 2013-05-08 DIAGNOSIS — Z90710 Acquired absence of both cervix and uterus: Secondary | ICD-10-CM | POA: Insufficient documentation

## 2013-05-08 DIAGNOSIS — N3020 Other chronic cystitis without hematuria: Secondary | ICD-10-CM | POA: Insufficient documentation

## 2013-05-08 DIAGNOSIS — Z923 Personal history of irradiation: Secondary | ICD-10-CM | POA: Insufficient documentation

## 2013-05-08 DIAGNOSIS — N390 Urinary tract infection, site not specified: Secondary | ICD-10-CM | POA: Insufficient documentation

## 2013-05-08 DIAGNOSIS — E7800 Pure hypercholesterolemia, unspecified: Secondary | ICD-10-CM | POA: Insufficient documentation

## 2013-05-08 DIAGNOSIS — K219 Gastro-esophageal reflux disease without esophagitis: Secondary | ICD-10-CM | POA: Insufficient documentation

## 2013-05-08 DIAGNOSIS — Z8542 Personal history of malignant neoplasm of other parts of uterus: Secondary | ICD-10-CM | POA: Insufficient documentation

## 2013-05-08 HISTORY — PX: CYSTOSCOPY/RETROGRADE/URETEROSCOPY: SHX5316

## 2013-05-08 SURGERY — CYSTOSCOPY/RETROGRADE/URETEROSCOPY
Anesthesia: General | Laterality: Bilateral | Wound class: Clean Contaminated

## 2013-05-08 MED ORDER — IOHEXOL 300 MG/ML  SOLN
INTRAMUSCULAR | Status: AC
  Filled 2013-05-08: qty 1

## 2013-05-08 MED ORDER — PROPOFOL 10 MG/ML IV BOLUS
INTRAVENOUS | Status: DC | PRN
  Administered 2013-05-08: 160 mg via INTRAVENOUS
  Administered 2013-05-08: 40 mg via INTRAVENOUS

## 2013-05-08 MED ORDER — MEPERIDINE HCL 50 MG/ML IJ SOLN: 6.2500 mg | INTRAMUSCULAR | Status: DC | PRN

## 2013-05-08 MED ORDER — OXYCODONE HCL 5 MG/5ML PO SOLN
5.0000 mg | Freq: Once | ORAL | Status: DC | PRN
  Filled 2013-05-08: qty 5

## 2013-05-08 MED ORDER — GENTAMICIN SULFATE 40 MG/ML IJ SOLN
110.0000 mg | Freq: Once | INTRAVENOUS | Status: AC
  Administered 2013-05-08: 110 mg via INTRAVENOUS
  Filled 2013-05-08: qty 2.75

## 2013-05-08 MED ORDER — MIDAZOLAM HCL 5 MG/5ML IJ SOLN
INTRAMUSCULAR | Status: DC | PRN
  Administered 2013-05-08: 1 mg via INTRAVENOUS

## 2013-05-08 MED ORDER — EPHEDRINE SULFATE 50 MG/ML IJ SOLN
INTRAMUSCULAR | Status: DC | PRN
  Administered 2013-05-08: 5 mg via INTRAVENOUS
  Administered 2013-05-08: 10 mg via INTRAVENOUS

## 2013-05-08 MED ORDER — OXYCODONE HCL 5 MG PO TABS: 5.0000 mg | ORAL_TABLET | Freq: Once | ORAL | Status: DC | PRN

## 2013-05-08 MED ORDER — STERILE WATER FOR IRRIGATION IR SOLN
Status: DC | PRN
  Administered 2013-05-08: 1500 mL via INTRAVESICAL

## 2013-05-08 MED ORDER — BELLADONNA ALKALOIDS-OPIUM 16.2-60 MG RE SUPP
RECTAL | Status: DC | PRN
  Administered 2013-05-08: 1 via RECTAL

## 2013-05-08 MED ORDER — LACTATED RINGERS IV SOLN
INTRAVENOUS | Status: DC
  Administered 2013-05-08: 13:00:00 via INTRAVENOUS
  Administered 2013-05-08: 1000 mL via INTRAVENOUS

## 2013-05-08 MED ORDER — HYDROMORPHONE HCL PF 1 MG/ML IJ SOLN: 0.2500 mg | INTRAMUSCULAR | Status: DC | PRN

## 2013-05-08 MED ORDER — IOHEXOL 300 MG/ML  SOLN
INTRAMUSCULAR | Status: DC | PRN
  Administered 2013-05-08: 10 mL

## 2013-05-08 MED ORDER — PROMETHAZINE HCL 25 MG/ML IJ SOLN: 6.2500 mg | INTRAMUSCULAR | Status: DC | PRN

## 2013-05-08 MED ORDER — LIDOCAINE HCL (CARDIAC) 10 MG/ML IV SOLN
INTRAVENOUS | Status: DC | PRN
  Administered 2013-05-08: 80 mg via INTRAVENOUS

## 2013-05-08 MED ORDER — GENTAMICIN SULFATE 40 MG/ML IJ SOLN: 5.0000 mg/kg | Freq: Once | INTRAMUSCULAR | Status: DC

## 2013-05-08 MED ORDER — BELLADONNA ALKALOIDS-OPIUM 16.2-60 MG RE SUPP
RECTAL | Status: AC
  Filled 2013-05-08: qty 1

## 2013-05-08 MED ORDER — CEFAZOLIN SODIUM-DEXTROSE 2-3 GM-% IV SOLR
2.0000 g | INTRAVENOUS | Status: AC
  Administered 2013-05-08: 2 g via INTRAVENOUS

## 2013-05-08 MED ORDER — CEFAZOLIN SODIUM-DEXTROSE 2-3 GM-% IV SOLR
INTRAVENOUS | Status: AC
  Filled 2013-05-08: qty 50

## 2013-05-08 MED ORDER — FENTANYL CITRATE 0.05 MG/ML IJ SOLN
INTRAMUSCULAR | Status: DC | PRN
  Administered 2013-05-08 (×2): 25 ug via INTRAVENOUS
  Administered 2013-05-08: 50 ug via INTRAVENOUS
  Administered 2013-05-08 (×2): 25 ug via INTRAVENOUS
  Administered 2013-05-08: 50 ug via INTRAVENOUS
  Administered 2013-05-08 (×2): 25 ug via INTRAVENOUS

## 2013-05-08 SURGICAL SUPPLY — 20 items
BAG URINE DRAINAGE (UROLOGICAL SUPPLIES) ×2 IMPLANT
BAG URO CATCHER STRL LF (DRAPE) ×2 IMPLANT
BASKET STNLS GEMINI 4WIRE 3FR (BASKET) IMPLANT
CATH FOLEY 2WAY SLVR  5CC 18FR (CATHETERS) ×1
CATH FOLEY 2WAY SLVR 5CC 18FR (CATHETERS) ×1 IMPLANT
CATH INTERMIT  6FR 70CM (CATHETERS) ×2 IMPLANT
CATH URET 5FR 28IN CONE TIP (BALLOONS)
CATH URET 5FR 70CM CONE TIP (BALLOONS) IMPLANT
CLOTH BEACON ORANGE TIMEOUT ST (SAFETY) ×2 IMPLANT
DRAPE CAMERA CLOSED 9X96 (DRAPES) ×2 IMPLANT
GLOVE BIOGEL M STRL SZ7.5 (GLOVE) ×2 IMPLANT
GOWN PREVENTION PLUS XLARGE (GOWN DISPOSABLE) IMPLANT
GOWN STRL NON-REIN LRG LVL3 (GOWN DISPOSABLE) IMPLANT
GOWN STRL REIN XL XLG (GOWN DISPOSABLE) IMPLANT
GUIDEWIRE ANG ZIPWIRE 038X150 (WIRE) IMPLANT
GUIDEWIRE STR DUAL SENSOR (WIRE) ×2 IMPLANT
MANIFOLD NEPTUNE II (INSTRUMENTS) ×2 IMPLANT
PACK CYSTO (CUSTOM PROCEDURE TRAY) ×2 IMPLANT
STENT CONTOUR 6FRX24X.038 (STENTS) ×2 IMPLANT
TUBING CONNECTING 10 (TUBING) ×2 IMPLANT

## 2013-05-08 NOTE — Op Note (Signed)
Preoperative Diagnosis: Incomplete bladder emptying Hydronephrosis Chronic renal failure Bladder neoplasm  Postop diagnosis: Same  Procedure: Exam under anesthesia Cystoscopy Right retrograde pyelogram Left retrograde pyelogram Left ureteroscopy Left renal wash for cytology Left left ureteral stent placement Bladder biopsy and fulguration   surgeon: Jouri Threat  Type of anesthesia: Gen.  Findings: On exam under anesthesia the vagina is closed approximately a centimeter above the urethral meatus therefore bimanual palpation of the bladder and urethra are not feasible. The abdomen was obese but soft and nontender without mass. There was no palpable bladder mass.  On cystoscopy the bladder had reduced capacity. There were erythematous changes to the mucosa posteriorly on the right and the dome. These areas were biopsied. No obvious tumor or papillary tumor. No foreign body or stitch. The ureteral orifice these were widely patent and likely refluxing.   Right retrograde pyelogram - the ureter appeared mildly dilated without filling defect or stricture, it narrowed and curved at the UPJ but this was patent in the collecting system was mildly dilated without filling defect. There is a single ureter single collecting system unit. The washout was reasonable on the MAG3 therefore I did not perform ureteroscopy or leave the stent.   Left retrograde pyelogram - this outlined a mildly dilated ureter all the way up to the proximal ureter/UPJ where it narrowed significantly. It was difficult to get contrast through the ureteropelvic junction. The collecting system filled out and was dilated but without filling defect.  On left ureteroscopy there was a UPJ obstruction. The mucosa was smooth without tumor or stricture. The UPJ did allow the scope to pass but functionally collapsed and appeared obstructing. Given the delayed washout and appearance of UPJ obstruction on the MAG3 I decided to leave the  stent to see if her relative function and creatinine would improve. The collecting system itself had some hemorrhagic changes to the mucosa likely from chronic dilation. There was no obvious neoplasm or papillary tumor. The collecting system was washed with saline.  Taken as a whole I suspect she has diminished capacity and compliance of her bladder, poor emptying, chronic reflux with secondary UPJ obstruction.  Description of procedure: After consent was obtained patient brought the operating room. After adequate anesthesia she was placed in lithotomy position and prepped and draped in the usual fashion. A timeout was performed to confirm the patient and procedure. The cystoscope was passed per urethra and the bladder examined in its entirety. An open-ended catheter was used to inject contrast retrograde up the right ureter. In a similar fashion contrast was injected up the left ureter. A sensor wire was then advanced and the ureter and over the wire a digital ureteroscope was passed. Sitting in the proximal ureter one could see the UPJ was obstructed visually but did widen and off for the scope to pass. Inspection of the collecting system was then undertaken. The collecting system was washed with saline. The wire was readvanced and the scope removed.   The wire was backloaded on the cystoscope and a 6 x 24 cm left ureteral stent was placed. A good coil was seen in the kidney and a good coil in the bladder.  Next attention was turned to the bladder and posterior biopsies were performed with the flexible biopsy forceps. The biopsies were not overaggressive or deep and fulguration was just enough to provide hemostasis. Similarly the right side of the bladder was biopsied in the dome. Under low-pressure hemostasis was excellent from the biopsy sites after fulguration.  An  23 French Foley was placed in the bladder drained. The patient was awakened and taken to recovery room in stable  condition.  Complications: None Blood loss: Minimal  Specimens: 1) Left renal wash for cytology 2) posterior Bladder biopsy  3) right bladder bx 4) dome bladder biopsy  Drains: 18 French Foley  Disposition: Patient stable to PACU

## 2013-05-08 NOTE — Anesthesia Postprocedure Evaluation (Signed)
Anesthesia Post Note  Patient: Jillian Guerra  Procedure(s) Performed: Procedure(s) (LRB): CYSTOSCOPY/BLADDER BIOPSY/BILATERAL RETROGRADE PYELOGRAM (Bilateral)  Anesthesia type: General  Patient location: PACU  Post pain: Pain level controlled  Post assessment: Post-op Vital signs reviewed  Last Vitals: BP 104/52  Pulse 64  Temp(Src) 36.1 C (Oral)  Resp 16  Ht 5' 4.96" (1.65 m)  Wt 227 lb 1.2 oz (103 kg)  BMI 37.83 kg/m2  SpO2 95%  Post vital signs: Reviewed  Level of consciousness: sedated  Complications: No apparent anesthesia complications

## 2013-05-08 NOTE — Anesthesia Preprocedure Evaluation (Addendum)
Anesthesia Evaluation  Patient identified by MRN, date of birth, ID band Patient awake    Reviewed: Allergy & Precautions, H&P , NPO status , Patient's Chart, lab work & pertinent test results  Airway Mallampati: II TM Distance: >3 FB Neck ROM: Full    Dental  (+) Dental Advisory Given, Edentulous Upper and Edentulous Lower   Pulmonary pneumonia -, resolved,  breath sounds clear to auscultation        Cardiovascular hypertension, Pt. on medications Rhythm:Regular Rate:Normal     Neuro/Psych negative neurological ROS  negative psych ROS   GI/Hepatic Neg liver ROS, GERD-  Medicated,  Endo/Other  negative endocrine ROS  Renal/GU negative Renal ROS     Musculoskeletal negative musculoskeletal ROS (+)   Abdominal (+) + obese,   Peds  Hematology negative hematology ROS (+)   Anesthesia Other Findings   Reproductive/Obstetrics negative OB ROS                          Anesthesia Physical Anesthesia Plan  ASA: III  Anesthesia Plan: General   Post-op Pain Management:    Induction: Intravenous  Airway Management Planned: LMA  Additional Equipment:   Intra-op Plan:   Post-operative Plan: Extubation in OR  Informed Consent: I have reviewed the patients History and Physical, chart, labs and discussed the procedure including the risks, benefits and alternatives for the proposed anesthesia with the patient or authorized representative who has indicated his/her understanding and acceptance.   Dental advisory given  Plan Discussed with: CRNA  Anesthesia Plan Comments:         Anesthesia Quick Evaluation

## 2013-05-08 NOTE — Transfer of Care (Signed)
Immediate Anesthesia Transfer of Care Note  Patient: Jillian Guerra  Procedure(s) Performed: Procedure(s) with comments: CYSTOSCOPY/BLADDER BIOPSY/BILATERAL RETROGRADE PYELOGRAM (Bilateral) - left ureteroscpoy  Patient Location: PACU  Anesthesia Type:General  Level of Consciousness: awake, alert , oriented and patient cooperative  Airway & Oxygen Therapy: Patient Spontanous Breathing and Patient connected to face mask oxygen  Post-op Assessment: Report given to PACU RN, Post -op Vital signs reviewed and stable and Patient moving all extremities X 4  Post vital signs: stable  Complications: No apparent anesthesia complications

## 2013-05-09 ENCOUNTER — Encounter (HOSPITAL_COMMUNITY): Payer: Self-pay | Admitting: Urology

## 2013-05-21 ENCOUNTER — Other Ambulatory Visit (HOSPITAL_COMMUNITY): Payer: Self-pay | Admitting: Urology

## 2013-05-21 DIAGNOSIS — N1330 Unspecified hydronephrosis: Secondary | ICD-10-CM

## 2013-06-08 ENCOUNTER — Encounter (HOSPITAL_COMMUNITY)
Admission: RE | Admit: 2013-06-08 | Discharge: 2013-06-08 | Disposition: A | Discharge status: Home / Self Care | Payer: Medicare Other | Source: Ambulatory Visit | Attending: Urology | Admitting: Urology

## 2013-06-08 DIAGNOSIS — N1330 Unspecified hydronephrosis: Secondary | ICD-10-CM | POA: Insufficient documentation

## 2013-06-08 MED ORDER — TECHNETIUM TC 99M MERTIATIDE
15.5000 | Freq: Once | INTRAVENOUS | Status: AC | PRN
  Administered 2013-06-08: 16 via INTRAVENOUS

## 2013-06-08 MED ORDER — FUROSEMIDE 10 MG/ML IJ SOLN
40.0000 mg | Freq: Once | INTRAMUSCULAR | Status: AC
  Administered 2013-06-08: 40 mg via INTRAVENOUS
  Filled 2013-06-08: qty 4

## 2014-06-11 DIAGNOSIS — R339 Retention of urine, unspecified: Secondary | ICD-10-CM | POA: Diagnosis present

## 2014-06-11 DIAGNOSIS — N184 Chronic kidney disease, stage 4 (severe): Secondary | ICD-10-CM | POA: Diagnosis present

## 2015-06-05 ENCOUNTER — Emergency Department (HOSPITAL_BASED_OUTPATIENT_CLINIC_OR_DEPARTMENT_OTHER): Payer: Medicare Other

## 2015-06-05 ENCOUNTER — Telehealth: Payer: Self-pay | Admitting: Urology

## 2015-06-05 ENCOUNTER — Emergency Department (HOSPITAL_BASED_OUTPATIENT_CLINIC_OR_DEPARTMENT_OTHER)
Admission: EM | Admit: 2015-06-05 | Discharge: 2015-06-05 | Disposition: A | Discharge status: Home / Self Care | Payer: Medicare Other | Attending: Emergency Medicine | Admitting: Emergency Medicine

## 2015-06-05 ENCOUNTER — Encounter (HOSPITAL_BASED_OUTPATIENT_CLINIC_OR_DEPARTMENT_OTHER): Payer: Self-pay | Admitting: *Deleted

## 2015-06-05 DIAGNOSIS — M549 Dorsalgia, unspecified: Secondary | ICD-10-CM | POA: Insufficient documentation

## 2015-06-05 DIAGNOSIS — Z7982 Long term (current) use of aspirin: Secondary | ICD-10-CM | POA: Insufficient documentation

## 2015-06-05 DIAGNOSIS — Z86718 Personal history of other venous thrombosis and embolism: Secondary | ICD-10-CM | POA: Diagnosis not present

## 2015-06-05 DIAGNOSIS — Z862 Personal history of diseases of the blood and blood-forming organs and certain disorders involving the immune mechanism: Secondary | ICD-10-CM | POA: Diagnosis not present

## 2015-06-05 DIAGNOSIS — Z8719 Personal history of other diseases of the digestive system: Secondary | ICD-10-CM | POA: Insufficient documentation

## 2015-06-05 DIAGNOSIS — Z79899 Other long term (current) drug therapy: Secondary | ICD-10-CM | POA: Diagnosis not present

## 2015-06-05 DIAGNOSIS — M1990 Unspecified osteoarthritis, unspecified site: Secondary | ICD-10-CM | POA: Diagnosis not present

## 2015-06-05 DIAGNOSIS — R600 Localized edema: Secondary | ICD-10-CM | POA: Insufficient documentation

## 2015-06-05 DIAGNOSIS — Z8701 Personal history of pneumonia (recurrent): Secondary | ICD-10-CM | POA: Insufficient documentation

## 2015-06-05 DIAGNOSIS — I10 Essential (primary) hypertension: Secondary | ICD-10-CM | POA: Diagnosis not present

## 2015-06-05 DIAGNOSIS — R109 Unspecified abdominal pain: Secondary | ICD-10-CM | POA: Insufficient documentation

## 2015-06-05 DIAGNOSIS — Z792 Long term (current) use of antibiotics: Secondary | ICD-10-CM | POA: Diagnosis not present

## 2015-06-05 DIAGNOSIS — R110 Nausea: Secondary | ICD-10-CM | POA: Insufficient documentation

## 2015-06-05 DIAGNOSIS — Z8542 Personal history of malignant neoplasm of other parts of uterus: Secondary | ICD-10-CM | POA: Diagnosis not present

## 2015-06-05 DIAGNOSIS — E785 Hyperlipidemia, unspecified: Secondary | ICD-10-CM | POA: Diagnosis not present

## 2015-06-05 LAB — COMPREHENSIVE METABOLIC PANEL
ALBUMIN: 3.9 g/dL (ref 3.5–5.0)
ALT: 24 U/L (ref 14–54)
AST: 33 U/L (ref 15–41)
Alkaline Phosphatase: 122 U/L (ref 38–126)
Anion gap: 11 (ref 5–15)
BUN: 41 mg/dL — AB (ref 6–20)
CHLORIDE: 104 mmol/L (ref 101–111)
CO2: 25 mmol/L (ref 22–32)
Calcium: 9.8 mg/dL (ref 8.9–10.3)
Creatinine, Ser: 1.9 mg/dL — ABNORMAL HIGH (ref 0.44–1.00)
GFR calc Af Amer: 29 mL/min — ABNORMAL LOW (ref >60)
GFR, EST NON AFRICAN AMERICAN: 25 mL/min — AB (ref >60)
Glucose, Bld: 137 mg/dL — ABNORMAL HIGH (ref 65–99)
POTASSIUM: 4.2 mmol/L (ref 3.5–5.1)
Sodium: 140 mmol/L (ref 135–145)
Total Bilirubin: 0.6 mg/dL (ref 0.3–1.2)
Total Protein: 7.8 g/dL (ref 6.5–8.1)

## 2015-06-05 LAB — URINALYSIS, ROUTINE W REFLEX MICROSCOPIC
Bilirubin Urine: NEGATIVE
GLUCOSE, UA: NEGATIVE mg/dL
Ketones, ur: NEGATIVE mg/dL
Nitrite: POSITIVE — AB
PROTEIN: 100 mg/dL — AB
Specific Gravity, Urine: 1.012 (ref 1.005–1.030)
Urobilinogen, UA: 0.2 mg/dL (ref 0.0–1.0)
pH: 6.5 (ref 5.0–8.0)

## 2015-06-05 LAB — URINE MICROSCOPIC-ADD ON

## 2015-06-05 LAB — CBC WITH DIFFERENTIAL/PLATELET
BASOS ABS: 0 10*3/uL (ref 0.0–0.1)
BASOS PCT: 0 % (ref 0–1)
EOS ABS: 0.1 10*3/uL (ref 0.0–0.7)
EOS PCT: 3 % (ref 0–5)
HCT: 37.8 % (ref 36.0–46.0)
Hemoglobin: 12.2 g/dL (ref 12.0–15.0)
Lymphocytes Relative: 30 % (ref 12–46)
Lymphs Abs: 0.9 10*3/uL (ref 0.7–4.0)
MCH: 30.8 pg (ref 26.0–34.0)
MCHC: 32.3 g/dL (ref 30.0–36.0)
MCV: 95.5 fL (ref 78.0–100.0)
MONO ABS: 0.1 10*3/uL (ref 0.1–1.0)
Monocytes Relative: 2 % — ABNORMAL LOW (ref 3–12)
Neutro Abs: 1.9 10*3/uL (ref 1.7–7.7)
Neutrophils Relative %: 65 % (ref 43–77)
PLATELETS: 207 10*3/uL (ref 150–400)
RBC: 3.96 MIL/uL (ref 3.87–5.11)
RDW: 13.1 % (ref 11.5–15.5)
WBC: 3 10*3/uL — ABNORMAL LOW (ref 4.0–10.5)

## 2015-06-05 MED ORDER — HYDROCODONE-ACETAMINOPHEN 5-325 MG PO TABS: 1.0000 | ORAL_TABLET | Freq: Four times a day (QID) | ORAL | Status: DC | PRN

## 2015-06-05 MED ORDER — ONDANSETRON HCL 4 MG/2ML IJ SOLN
INTRAMUSCULAR | Status: AC
  Filled 2015-06-05: qty 2

## 2015-06-05 MED ORDER — MORPHINE SULFATE (PF) 4 MG/ML IV SOLN
4.0000 mg | Freq: Once | INTRAVENOUS | Status: AC
  Administered 2015-06-05: 4 mg via INTRAVENOUS
  Filled 2015-06-05: qty 1

## 2015-06-05 MED ORDER — ONDANSETRON HCL 4 MG/2ML IJ SOLN
4.0000 mg | Freq: Once | INTRAMUSCULAR | Status: AC
  Administered 2015-06-05: 4 mg via INTRAVENOUS

## 2015-06-05 MED ORDER — SULFAMETHOXAZOLE-TRIMETHOPRIM 800-160 MG PO TABS: 1.0000 | ORAL_TABLET | Freq: Two times a day (BID) | ORAL | Status: AC

## 2015-06-05 MED ORDER — ONDANSETRON HCL 4 MG/2ML IJ SOLN
4.0000 mg | Freq: Once | INTRAMUSCULAR | Status: AC
  Administered 2015-06-05: 4 mg via INTRAVENOUS
  Filled 2015-06-05: qty 2

## 2015-06-05 MED ORDER — ONDANSETRON 4 MG PO TBDP: 4.0000 mg | ORAL_TABLET | Freq: Three times a day (TID) | ORAL | Status: DC | PRN

## 2015-06-05 MED ORDER — SODIUM CHLORIDE 0.9 % IV BOLUS (SEPSIS)
1000.0000 mL | Freq: Once | INTRAVENOUS | Status: AC
  Administered 2015-06-05: 1000 mL via INTRAVENOUS

## 2015-06-05 NOTE — ED Notes (Signed)
Back from CT, no changes, alert, NAD, calm, interactive, resps e/u, speaking in clear complete sentences.

## 2015-06-05 NOTE — ED Notes (Signed)
Dr. Dina Rich into room, family x2 at [facility]. Pt to CT.

## 2015-06-05 NOTE — ED Notes (Addendum)
Pt here by POV with family, alert, NAD, calm, interactive, no dyspnea. C/o R sided low back and flank pain, also nausea, shaky, "freezing cold", (denies: vd or known fever), last BM at 0400 (normal), no meds PTA, "has a chronic indwelling foley, last placed last Wednesday, changed every 2 weeks. Rates pain 10/10. "urine looks bad/ dark". Followed at [facility].

## 2015-06-05 NOTE — ED Provider Notes (Signed)
CSN: [id number]     Arrival date & time 06/05/15  0551 History   First MD Initiated Contact with Patient 06/05/15 [phone]     Chief Complaint  Patient presents with  . Back Pain     (Consider location/radiation/quality/duration/timing/severity/associated sxs/prior Treatment) HPI  This is a 74 year old female with a history of uterine cancer, DVT, and renal disease who presents with flank pain. Patient reports abrupt onset of right-sided flank pain and abdominal pain started at approximate 4:30 this morning. She states that the pain is sharp and wraps around her right flank. Current pain is 10 out of 10. She reports nausea without vomiting. No known diarrhea. Last normal bowel movement was yesterday. No history of kidney stones. Patient does have a chronic indwelling catheter.   Per chart review, patient followed by nephrology and urology.  Hisotry of hydronephrosis and urine that grows out MDR pathogens.  Recommendation by ID to not culture urine unless assymptomatic.  Past Medical History  Diagnosis Date  . Arthritis   . Uterine cancer   . Leukocytopenia     transient  . Hypertension   . Deep vein blood clot of left lower extremity   . 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 Hypercholesteremia   . GERD (gastroesophageal reflux disease)   . Radiation 1998    hx of  . Anemia    Past Surgical History  Procedure Laterality Date  . Appendectomy    . Cholecystectomy    . Total abdominal hysterectomy  1998  . Stapedectomy Bilateral [id number]  . Cystoscopy/retrograde/ureteroscopy Bilateral 05/08/2013    Procedure: CYSTOSCOPY/BLADDER BIOPSY/BILATERAL RETROGRADE PYELOGRAM;  Surgeon: Fredricka Bonine, MD;  Location: [facility];  Service: Urology;  Laterality: Bilateral;  left ureteroscpoy   Family History  Problem Relation Age of Onset  . Diabetes Sister    Social History  Substance Use Topics  . Smoking status: Never Smoker   . Smokeless tobacco: Never Used  . Alcohol Use: No   OB History     No data available     Review of Systems  Constitutional: Negative for fever.  Respiratory: Negative for cough, chest tightness and shortness of breath.   Cardiovascular: Negative for chest pain.  Gastrointestinal: Positive for nausea. Negative for vomiting, abdominal pain, diarrhea and constipation.  Genitourinary: Positive for flank pain. Negative for dysuria and hematuria.  Neurological: Negative for headaches.  All other systems reviewed and are negative.     Allergies  Ciprofloxacin  Home Medications   Prior to Admission medications   Medication Sig Start Date End Date Taking? Authorizing Provider  acetaminophen (TYLENOL) 325 MG tablet Take 650 mg by mouth every 6 (six) hours as needed for pain.     Historical Provider, MD  aspirin EC 81 MG tablet Take 81 mg by mouth daily.    Historical Provider, MD  famotidine (PEPCID) 20 MG tablet Take 20 mg by mouth 2 (two) times daily as needed for heartburn.    Historical Provider, MD  HYDROcodone-acetaminophen (NORCO/VICODIN) 5-325 MG per tablet Take 1-2 tablets by mouth every 6 (six) hours as needed. 06/05/15   Merryl Hacker, MD  ondansetron (ZOFRAN ODT) 4 MG disintegrating tablet Take 1 tablet (4 mg total) by mouth every 8 (eight) hours as needed for nausea or vomiting. 06/05/15   Merryl Hacker, MD  simvastatin (ZOCOR) 20 MG tablet Take 20 mg by mouth at bedtime.     Historical Provider, MD  sulfamethoxazole-trimethoprim (BACTRIM DS,SEPTRA DS) 800-160 MG per tablet Take  1 tablet by mouth 2 (two) times daily.    Historical Provider, MD  triamterene-hydrochlorothiazide (MAXZIDE-25) 37.5-25 MG per tablet Take 1 tablet by mouth every morning.     Historical Provider, MD   BP 132/74 mmHg  Pulse 102  Temp(Src) 98.8 F (37.1 C) (Oral)  Resp 20  Ht 5\' 5"  (1.651 m)  Wt 242 lb (109.77 kg)  BMI 40.27 kg/m2  SpO2 93% Physical Exam  Constitutional: She is oriented to person, place, and time. No distress.  Uncomfortable appearing but  nontoxic, retching  HENT:  Head: Normocephalic and atraumatic.  Cardiovascular: Normal rate, regular rhythm and normal heart sounds.   No murmur heard. Pulmonary/Chest: Effort normal. No respiratory distress. She has no wheezes.  Abdominal: Soft. Bowel sounds are normal. There is no tenderness. There is no rebound and no guarding.  Genitourinary:  No CVA tenderness  Musculoskeletal: She exhibits edema.  Neurological: She is alert and oriented to person, place, and time.  Skin: Skin is warm and dry.  Psychiatric: She has a normal mood and affect.  Nursing note and vitals reviewed.   ED Course  Procedures (including critical care time) Labs Review Labs Reviewed  CBC WITH DIFFERENTIAL/PLATELET - Abnormal; Notable for the following:    WBC 3.0 (*)    Monocytes Relative 2 (*)    All other components within normal limits  COMPREHENSIVE METABOLIC PANEL - Abnormal; Notable for the following:    Glucose, Bld 137 (*)    BUN 41 (*)    Creatinine, Ser 1.90 (*)    GFR calc non Af Amer 25 (*)    GFR calc Af Amer 29 (*)    All other components within normal limits  URINALYSIS, ROUTINE W REFLEX MICROSCOPIC (NOT AT [facility]) - Abnormal; Notable for the following:    APPearance CLOUDY (*)    Hgb urine dipstick LARGE (*)    Protein, ur 100 (*)    Nitrite POSITIVE (*)    Leukocytes, UA LARGE (*)    All other components within normal limits  URINE MICROSCOPIC-ADD ON - Abnormal; Notable for the following:    Squamous Epithelial / LPF FEW (*)    Bacteria, UA MANY (*)    All other components within normal limits  URINE CULTURE    Imaging Review Ct Renal Stone Study  06/05/2015   CLINICAL DATA:  Right flank pain radiating to right lower quadrant.  EXAM: CT ABDOMEN AND PELVIS WITHOUT CONTRAST  TECHNIQUE: Multidetector CT imaging of the abdomen and pelvis was performed following the standard protocol without IV contrast.  COMPARISON:  06/08/2013  FINDINGS: Lower chest and abdominal wall: Lower ventral  abdominal wall laxity and a supraumbilical small fatty umbilical hernia.  Calcified granuloma in the left lower lobe. Diffuse coronary artery a calcification.  Hepatobiliary: No focal liver abnormality.Cholecystectomy. No common bile duct enlargement or calcified stone.  Pancreas: Unremarkable.  Spleen: Unremarkable.  Adrenals/Urinary Tract: Unremarkable adrenal glands. Moderate hydro ureteral nephrosis and asymmetric right perinephric edema and renal expansion. No visible stone. No gross mass. No extraluminal nodularity suspicious for recurring uterine cancer. Bilateral renal cortical lobulation and thinning, greater on the left with stable pattern from 2014. Mild perivesicular fat infiltration, possibly sequela of radiation. Urinalysis is pending. Foley catheter is in good position.  Reproductive:Hysterectomy and oophorectomies with pelvic lymphadenectomy. No suspicious nodes.  Stomach/Bowel: No bowel obstruction or inflammatory change. Extensive distal colonic diverticulosis. Appendectomy.  Vascular/Lymphatic: Diffuse atherosclerosis. No acute abnormality. No mass or adenopathy.  Peritoneal: No effusion or pneumoperitoneum.  Musculoskeletal: Remote sacral insufficiency fracture of the left sacral ala and left S1 body, with still visible fracture lucency, unchanged from 2014. Patchy sclerosis in the sacrum and bilateral medial ileum, likely from remote pelvic radiation. Chronic sclerotic appearance of the bilateral femoral heads with benign-appearing lucencies. Degenerative disc and facet disease is diffuse and severe, with grade 1 anterolisthesis at L4-5. Spinal canal stenosis is multilevel and high-grade in the lumbar spine.  IMPRESSION: 1. Moderate right hydroureteronephrosis without visible cause. 2. Left more than right renal atrophy. 3. Pelvic radiation changes with chronic sacral insufficiency fracture. 4. Other chronic findings are stable from 2014 and noted above.   Electronically Signed   By: Monte Fantasia M.D.   On: 06/05/2015 07:14   I have personally reviewed and evaluated these images and lab results as part of my medical decision-making.   EKG Interpretation None      MDM   Final diagnoses:  Flank pain   Patient presents with flank pain.  Uncomfortable but nontoxic.  VS reassuring.  History most suspicious for kidney stones.  S/P appendectomy and total hysterectomy.  Patient given fluids and pain medication.  CT obtained and shows hydro on the right.  I have compared with radiology reads and information in the records and it is unclear whether this is at the patient's baseline or worse. Maybe a recently passed stone? Patient had improvement of symptoms with pain and nausea meds.  Labs at baseline.  Discussed with patient and daughter sending urine for UA and culture given the flank pain.  GIven that she is afebrile without leukocytosis would hold treatment and defer to her primary nephrologist and ID.  Patient and daughter are in agreement with plan.  Will follow-up later today.  After history, exam, and medical workup I feel the patient has been appropriately medically screened and is safe for discharge home. Pertinent diagnoses were discussed with the patient. Patient was given return precautions.     Merryl Hacker, MD 06/05/15 1213

## 2015-06-05 NOTE — Discharge Instructions (Signed)
You were seen today for flank pain. The cause of her pain at this time is unclear. You do have hydronephrosis on the right but it is not clear whether this is new. You may have passed a kidney stone that cannot be seen on CT scan.  You should follow-up with both urology and nephrology.  Flank Pain Flank pain refers to pain that is located on the side of the body between the upper abdomen and the back. The pain may occur over a short period of time (acute) or may be long-term or reoccurring (chronic). It may be mild or severe. Flank pain can be caused by many things. CAUSES  Some of the more common causes of flank pain include:  Muscle strains.   Muscle spasms.   A disease of your spine (vertebral disk disease).   A lung infection (pneumonia).   Fluid around your lungs (pulmonary edema).   A kidney infection.   Kidney stones.   A very painful skin rash caused by the chickenpox virus (shingles).   Gallbladder disease.  [place] care will depend on the cause of your pain. In general,  Rest as directed by your caregiver.  Drink enough fluids to keep your urine clear or pale yellow.  Only take over-the-counter or prescription medicines as directed by your caregiver. Some medicines may help relieve the pain.  Tell your caregiver about any changes in your pain.  Follow up with your caregiver as directed. SEEK IMMEDIATE MEDICAL CARE IF:   Your pain is not controlled with medicine.   You have new or worsening symptoms.  Your pain increases.   You have abdominal pain.   You have shortness of breath.   You have persistent nausea or vomiting.   You have swelling in your abdomen.   You feel faint or pass out.   You have blood in your urine.  You have a fever or persistent symptoms for more than 2-3 days.  You have a fever and your symptoms suddenly get worse. MAKE SURE YOU:   Understand these instructions.  Will watch your  condition.  Will get help right away if you are not doing well or get worse. Document Released: 11/11/2005 Document Revised: 06/14/2012 Document Reviewed: 05/04/2012 [facility] Patient Information 2015 [place], [place]. This information is not intended to replace advice given to you by your health care provider. Make sure you discuss any questions you have with your health care provider.

## 2015-06-05 NOTE — ED Notes (Signed)
Dr. Dina Rich into room.

## 2015-06-05 NOTE — ED Notes (Signed)
Dr. Dina Rich in to speak with pt and family, updated. Urine obtained from indwelling foley cath and sent.

## 2015-06-11 NOTE — Telephone Encounter (Signed)
Error

## 2019-02-14 DIAGNOSIS — E785 Hyperlipidemia, unspecified: Secondary | ICD-10-CM | POA: Diagnosis present

## 2019-03-15 ENCOUNTER — Inpatient Hospital Stay (HOSPITAL_COMMUNITY)
Admission: EM | Admit: 2019-03-15 | Discharge: 2019-03-19 | DRG: 065 | Disposition: A | Discharge status: Home Health | Payer: Medicare Other | Attending: Internal Medicine | Admitting: Internal Medicine

## 2019-03-15 ENCOUNTER — Encounter (HOSPITAL_COMMUNITY): Payer: Self-pay | Admitting: Emergency Medicine

## 2019-03-15 DIAGNOSIS — I129 Hypertensive chronic kidney disease with stage 1 through stage 4 chronic kidney disease, or unspecified chronic kidney disease: Secondary | ICD-10-CM | POA: Diagnosis present

## 2019-03-15 DIAGNOSIS — D631 Anemia in chronic kidney disease: Secondary | ICD-10-CM | POA: Diagnosis present

## 2019-03-15 DIAGNOSIS — Z79891 Long term (current) use of opiate analgesic: Secondary | ICD-10-CM

## 2019-03-15 DIAGNOSIS — Z7982 Long term (current) use of aspirin: Secondary | ICD-10-CM

## 2019-03-15 DIAGNOSIS — R29700 NIHSS score 0: Secondary | ICD-10-CM | POA: Diagnosis present

## 2019-03-15 DIAGNOSIS — I639 Cerebral infarction, unspecified: Secondary | ICD-10-CM | POA: Diagnosis not present

## 2019-03-15 DIAGNOSIS — R479 Unspecified speech disturbances: Secondary | ICD-10-CM | POA: Diagnosis present

## 2019-03-15 DIAGNOSIS — Z8673 Personal history of transient ischemic attack (TIA), and cerebral infarction without residual deficits: Secondary | ICD-10-CM

## 2019-03-15 DIAGNOSIS — Z823 Family history of stroke: Secondary | ICD-10-CM

## 2019-03-15 DIAGNOSIS — Z86718 Personal history of other venous thrombosis and embolism: Secondary | ICD-10-CM

## 2019-03-15 DIAGNOSIS — K219 Gastro-esophageal reflux disease without esophagitis: Secondary | ICD-10-CM | POA: Diagnosis present

## 2019-03-15 DIAGNOSIS — Z6835 Body mass index (BMI) 35.0-35.9, adult: Secondary | ICD-10-CM | POA: Diagnosis not present

## 2019-03-15 DIAGNOSIS — Z923 Personal history of irradiation: Secondary | ICD-10-CM

## 2019-03-15 DIAGNOSIS — J42 Unspecified chronic bronchitis: Secondary | ICD-10-CM | POA: Diagnosis present

## 2019-03-15 DIAGNOSIS — N184 Chronic kidney disease, stage 4 (severe): Secondary | ICD-10-CM | POA: Diagnosis present

## 2019-03-15 DIAGNOSIS — N139 Obstructive and reflux uropathy, unspecified: Secondary | ICD-10-CM | POA: Diagnosis present

## 2019-03-15 DIAGNOSIS — E785 Hyperlipidemia, unspecified: Secondary | ICD-10-CM | POA: Diagnosis present

## 2019-03-15 DIAGNOSIS — R339 Retention of urine, unspecified: Secondary | ICD-10-CM | POA: Diagnosis not present

## 2019-03-15 DIAGNOSIS — Z79899 Other long term (current) drug therapy: Secondary | ICD-10-CM

## 2019-03-15 DIAGNOSIS — Z9049 Acquired absence of other specified parts of digestive tract: Secondary | ICD-10-CM

## 2019-03-15 DIAGNOSIS — Z8542 Personal history of malignant neoplasm of other parts of uterus: Secondary | ICD-10-CM | POA: Diagnosis not present

## 2019-03-15 DIAGNOSIS — Z833 Family history of diabetes mellitus: Secondary | ICD-10-CM

## 2019-03-15 DIAGNOSIS — Z90710 Acquired absence of both cervix and uterus: Secondary | ICD-10-CM

## 2019-03-15 DIAGNOSIS — Z881 Allergy status to other antibiotic agents status: Secondary | ICD-10-CM

## 2019-03-15 DIAGNOSIS — I4891 Unspecified atrial fibrillation: Secondary | ICD-10-CM | POA: Diagnosis present

## 2019-03-15 DIAGNOSIS — D638 Anemia in other chronic diseases classified elsewhere: Secondary | ICD-10-CM | POA: Diagnosis present

## 2019-03-15 DIAGNOSIS — I6340 Cerebral infarction due to embolism of unspecified cerebral artery: Secondary | ICD-10-CM | POA: Diagnosis present

## 2019-03-15 DIAGNOSIS — H9190 Unspecified hearing loss, unspecified ear: Secondary | ICD-10-CM | POA: Diagnosis present

## 2019-03-15 DIAGNOSIS — I959 Hypotension, unspecified: Secondary | ICD-10-CM | POA: Diagnosis present

## 2019-03-15 DIAGNOSIS — R4701 Aphasia: Secondary | ICD-10-CM | POA: Diagnosis present

## 2019-03-15 DIAGNOSIS — Z1159 Encounter for screening for other viral diseases: Secondary | ICD-10-CM

## 2019-03-15 DIAGNOSIS — Z66 Do not resuscitate: Secondary | ICD-10-CM | POA: Diagnosis present

## 2019-03-15 DIAGNOSIS — E7800 Pure hypercholesterolemia, unspecified: Secondary | ICD-10-CM | POA: Diagnosis present

## 2019-03-15 LAB — CBG MONITORING, ED: Glucose-Capillary: 136 mg/dL — ABNORMAL HIGH (ref 70–99)

## 2019-03-15 LAB — COMPREHENSIVE METABOLIC PANEL
ALT: 14 U/L (ref 0–44)
AST: 19 U/L (ref 15–41)
Albumin: 2.5 g/dL — ABNORMAL LOW (ref 3.5–5.0)
Alkaline Phosphatase: 86 U/L (ref 38–126)
Anion gap: 11 (ref 5–15)
BUN: 42 mg/dL — ABNORMAL HIGH (ref 8–23)
CO2: 25 mmol/L (ref 22–32)
Calcium: 9.1 mg/dL (ref 8.9–10.3)
Chloride: 104 mmol/L (ref 98–111)
Creatinine, Ser: 2.35 mg/dL — ABNORMAL HIGH (ref 0.44–1.00)
GFR calc Af Amer: 23 mL/min — ABNORMAL LOW (ref >60)
GFR calc non Af Amer: 19 mL/min — ABNORMAL LOW (ref >60)
Glucose, Bld: 152 mg/dL — ABNORMAL HIGH (ref 70–99)
Potassium: 4 mmol/L (ref 3.5–5.1)
Sodium: 140 mmol/L (ref 135–145)
Total Bilirubin: 0.4 mg/dL (ref 0.3–1.2)
Total Protein: 6 g/dL — ABNORMAL LOW (ref 6.5–8.1)

## 2019-03-15 LAB — I-STAT CHEM 8, ED
BUN: 50 mg/dL — ABNORMAL HIGH (ref 8–23)
Calcium, Ion: 1.12 mmol/L — ABNORMAL LOW (ref 1.15–1.40)
Chloride: 103 mmol/L (ref 98–111)
Creatinine, Ser: 2.3 mg/dL — ABNORMAL HIGH (ref 0.44–1.00)
Glucose, Bld: 147 mg/dL — ABNORMAL HIGH (ref 70–99)
HCT: 27 % — ABNORMAL LOW (ref 36.0–46.0)
Hemoglobin: 9.2 g/dL — ABNORMAL LOW (ref 12.0–15.0)
Potassium: 4.2 mmol/L (ref 3.5–5.1)
Sodium: 138 mmol/L (ref 135–145)
TCO2: 26 mmol/L (ref 22–32)

## 2019-03-15 LAB — CBC
HCT: 28.9 % — ABNORMAL LOW (ref 36.0–46.0)
Hemoglobin: 8.8 g/dL — ABNORMAL LOW (ref 12.0–15.0)
MCH: 32.2 pg (ref 26.0–34.0)
MCHC: 30.4 g/dL (ref 30.0–36.0)
MCV: 105.9 fL — ABNORMAL HIGH (ref 80.0–100.0)
Platelets: 231 10*3/uL (ref 150–400)
RBC: 2.73 MIL/uL — ABNORMAL LOW (ref 3.87–5.11)
RDW: 14.8 % (ref 11.5–15.5)
WBC: 6.6 10*3/uL (ref 4.0–10.5)
nRBC: 0 % (ref 0.0–0.2)

## 2019-03-15 LAB — DIFFERENTIAL
Abs Immature Granulocytes: 0.02 10*3/uL (ref 0.00–0.07)
Basophils Absolute: 0 10*3/uL (ref 0.0–0.1)
Basophils Relative: 1 %
Eosinophils Absolute: 0.1 10*3/uL (ref 0.0–0.5)
Eosinophils Relative: 1 %
Immature Granulocytes: 0 %
Lymphocytes Relative: 27 %
Lymphs Abs: 1.8 10*3/uL (ref 0.7–4.0)
Monocytes Absolute: 0.5 10*3/uL (ref 0.1–1.0)
Monocytes Relative: 7 %
Neutro Abs: 4.2 10*3/uL (ref 1.7–7.7)
Neutrophils Relative %: 64 %

## 2019-03-15 LAB — APTT: aPTT: 35 seconds (ref 24–36)

## 2019-03-15 LAB — PROTIME-INR
INR: 0.9 (ref 0.8–1.2)
Prothrombin Time: 12.4 seconds (ref 11.4–15.2)

## 2019-03-15 MED ORDER — SODIUM CHLORIDE 0.9% FLUSH
3.0000 mL | Freq: Once | INTRAVENOUS | Status: AC
  Administered 2019-03-16: 10:00:00 3 mL via INTRAVENOUS

## 2019-03-15 MED ORDER — SENNOSIDES-DOCUSATE SODIUM 8.6-50 MG PO TABS: 1.0000 | ORAL_TABLET | Freq: Every evening | ORAL | Status: DC | PRN

## 2019-03-15 MED ORDER — ASPIRIN EC 81 MG PO TBEC: 81.0000 mg | DELAYED_RELEASE_TABLET | Freq: Every day | ORAL | Status: DC

## 2019-03-15 MED ORDER — ATORVASTATIN CALCIUM 40 MG PO TABS
40.0000 mg | ORAL_TABLET | Freq: Every day | ORAL | Status: DC
  Administered 2019-03-16 – 2019-03-18 (×3): 40 mg via ORAL
  Filled 2019-03-15 (×3): qty 1

## 2019-03-15 MED ORDER — HEPARIN SODIUM (PORCINE) 5000 UNIT/ML IJ SOLN
5000.0000 [IU] | Freq: Three times a day (TID) | INTRAMUSCULAR | Status: DC
  Administered 2019-03-16 – 2019-03-19 (×10): 5000 [IU] via SUBCUTANEOUS
  Filled 2019-03-15 (×10): qty 1

## 2019-03-15 MED ORDER — ACETAMINOPHEN 650 MG RE SUPP: 650.0000 mg | RECTAL | Status: DC | PRN

## 2019-03-15 MED ORDER — ASPIRIN EC 81 MG PO TBEC
81.0000 mg | DELAYED_RELEASE_TABLET | Freq: Every day | ORAL | Status: DC
  Administered 2019-03-16 – 2019-03-19 (×5): 81 mg via ORAL
  Filled 2019-03-15 (×5): qty 1

## 2019-03-15 MED ORDER — ACETAMINOPHEN 325 MG PO TABS
650.0000 mg | ORAL_TABLET | ORAL | Status: DC | PRN
  Administered 2019-03-18 – 2019-03-19 (×3): 650 mg via ORAL
  Filled 2019-03-15 (×3): qty 2

## 2019-03-15 MED ORDER — ACETAMINOPHEN 160 MG/5ML PO SOLN: 650.0000 mg | ORAL | Status: DC | PRN

## 2019-03-15 MED ORDER — STROKE: EARLY STAGES OF RECOVERY BOOK
Freq: Once | Status: AC
  Administered 2019-03-16: 01:00:00
  Filled 2019-03-15: qty 1

## 2019-03-15 NOTE — ED Notes (Signed)
Updated daughter Earnest Bailey on pt's plan of care.

## 2019-03-15 NOTE — ED Notes (Signed)
Pt has 3+ pitting edema of left foot, 3+ pitting edema of LE bilat, 2+ pitting edema of right foot.

## 2019-03-15 NOTE — ED Triage Notes (Signed)
Pt in [place] with daughter, was informed by PCP to come be admitted to the hospital for a stroke. 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 Pt has no other neuro deficits, A/OX4. VSS.

## 2019-03-15 NOTE — ED Notes (Signed)
Pt was unable to lift legs for NIH, but that is baseline for her.

## 2019-03-15 NOTE — ED Notes (Signed)
ED TO INPATIENT HANDOFF REPORT  ED Nurse Name and Phone #: Lorrin Goodell, [place]  S Name/Age/Gender Jillian Guerra 78 y.o. female Room/Bed: 029C/029C  Code Status   Code Status: Not on file  Home/SNF/Other Home Patient oriented to: self, place, time and situation Is this baseline? Yes   Triage Complete: Triage complete  Chief Complaint Stroke  Triage Note Pt in [place] with daughter, was informed by PCP to come be admitted to the hospital for a stroke. 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 Pt has no other neuro deficits, A/OX4. VSS.    Allergies Allergies  Allergen Reactions  . Ciprofloxacin     Level of Care/Admitting Diagnosis ED Disposition    None      B Medical/Surgery History Past Medical History:  Diagnosis Date  . Anemia   . Arthritis   . Deep vein blood clot of left lower extremity ([place])   . GERD (gastroesophageal reflux disease)   . Hypercholesteremia   . Hypertension   . Leukocytopenia    transient  . 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 Radiation 1998   hx of  . Uterine cancer [facility])    Past Surgical History:  Procedure Laterality Date  . APPENDECTOMY    . CHOLECYSTECTOMY    . CYSTOSCOPY/RETROGRADE/URETEROSCOPY Bilateral 05/08/2013   Procedure: CYSTOSCOPY/BLADDER BIOPSY/BILATERAL RETROGRADE PYELOGRAM;  Surgeon: Fredricka Bonine, MD;  Location: [facility];  Service: Urology;  Laterality: Bilateral;  left ureteroscpoy  . STAPEDECTOMY Bilateral [id number]  . TOTAL ABDOMINAL HYSTERECTOMY  1998     A IV Location/Drains/Wounds Patient Lines/Drains/Airways Status   Active Line/Drains/Airways    Name:   Placement date:   Placement time:   Site:   Days:   Peripheral IV 03/15/19 Left Hand   03/15/19    2202    Hand   less than 1   Urethral Catheter Latex 18 Fr.   05/08/13    1236    Latex   2137   Ureteral Drain/Stent  Left ureter 6 Fr.   05/08/13    1221    Left ureter   2137          Intake/Output Last 24 hours No intake or output data in the 24 hours ending 03/15/19 2253  Labs/Imaging Results for orders placed or performed during the hospital encounter of 03/15/19 (from the past 48 hour(s))  Protime-INR     Status: None   Collection Time: 03/15/19  9:27 PM  Result Value Ref Range   Prothrombin Time 12.4 11.4 - 15.2 seconds   INR 0.9 0.8 - 1.2    Comment: (NOTE) INR goal varies based on device and disease states. Performed at [facility], [place] [address], [place], [place] [address]   APTT     Status: None   Collection Time: 03/15/19  9:27 PM  Result Value Ref Range   aPTT 35 24 - 36 seconds    Comment: Performed at [facility] [address] [place] [address]  CBC     Status: Abnormal   Collection Time: 03/15/19  9:27 PM  Result Value Ref Range   WBC 6.6 4.0 - 10.5 K/uL   RBC 2.73 (L) 3.87 - 5.11 MIL/uL   Hemoglobin 8.8 (L) 12.0 - 15.0 g/dL   HCT 28.9 (L) 36.0 - 46.0 %   MCV 105.9 (H)  80.0 - 100.0 fL   MCH 32.2 26.0 - 34.0 pg   MCHC 30.4 30.0 - 36.0 g/dL   RDW 14.8 11.5 - 15.5 %   Platelets 231 150 - 400 K/uL   nRBC 0.0 0.0 - 0.2 %    Comment: Performed at [facility], [place] [address] [place], [place] [address]  Differential     Status: None   Collection Time: 03/15/19  9:27 PM  Result Value Ref Range   Neutrophils Relative % 64 %   Neutro Abs 4.2 1.7 - 7.7 K/uL   Lymphocytes Relative 27 %   Lymphs Abs 1.8 0.7 - 4.0 K/uL   Monocytes Relative 7 %   Monocytes Absolute 0.5 0.1 - 1.0 K/uL   Eosinophils Relative 1 %   Eosinophils Absolute 0.1 0.0 - 0.5 K/uL   Basophils Relative 1 %   Basophils Absolute 0.0 0.0 - 0.1 K/uL   Immature Granulocytes 0 %   Abs Immature Granulocytes 0.02 0.00 - 0.07 K/uL    Comment: Performed at [facility], [place] [address] [place], [place] [address]  Comprehensive metabolic panel     Status: Abnormal   Collection  Time: 03/15/19  9:27 PM  Result Value Ref Range   Sodium 140 135 - 145 mmol/L   Potassium 4.0 3.5 - 5.1 mmol/L   Chloride 104 98 - 111 mmol/L   CO2 25 22 - 32 mmol/L   Glucose, Bld 152 (H) 70 - 99 mg/dL   BUN 42 (H) 8 - 23 mg/dL   Creatinine, Ser 2.35 (H) 0.44 - 1.00 mg/dL   Calcium 9.1 8.9 - 10.3 mg/dL   Total Protein 6.0 (L) 6.5 - 8.1 g/dL   Albumin 2.5 (L) 3.5 - 5.0 g/dL   AST 19 15 - 41 U/L   ALT 14 0 - 44 U/L   Alkaline Phosphatase 86 38 - 126 U/L   Total Bilirubin 0.4 0.3 - 1.2 mg/dL   GFR calc non Af Amer 19 (L) >60 mL/min   GFR calc Af Amer 23 (L) >60 mL/min   Anion gap 11 5 - 15    Comment: Performed at [facility], [place] [address] [place], [place] [address]  I-stat chem 8, ED     Status: Abnormal   Collection Time: 03/15/19  9:40 PM  Result Value Ref Range   Sodium 138 135 - 145 mmol/L   Potassium 4.2 3.5 - 5.1 mmol/L   Chloride 103 98 - 111 mmol/L   BUN 50 (H) 8 - 23 mg/dL   Creatinine, Ser 2.30 (H) 0.44 - 1.00 mg/dL   Glucose, Bld 147 (H) 70 - 99 mg/dL   Calcium, Ion 1.12 (L) 1.15 - 1.40 mmol/L   TCO2 26 22 - 32 mmol/L   Hemoglobin 9.2 (L) 12.0 - 15.0 g/dL   HCT 27.0 (L) 36.0 - 46.0 %  CBG monitoring, ED     Status: Abnormal   Collection Time: 03/15/19  9:58 PM  Result Value Ref Range   Glucose-Capillary 136 (H) 70 - 99 mg/dL   No results found.  Pending Labs Unresulted Labs (From admission, onward)    Start     Ordered   03/15/19 2204  SARS Coronavirus 2  Once,   R     03/15/19 2204          Vitals/Pain Today's Vitals   03/15/19 2121 03/15/19 2123 03/15/19 2125 03/15/19 2153  BP:   (!) 113/56 (!) 120/57  Pulse:   68 71  Resp:   18 (!) 21  Temp:   98.4 F (36.9 C)   TempSrc:   Oral   SpO2:   99% 100%  Weight:  90.7 kg    Height:  5\' 3"  (1.6 m)    PainSc: 0-No pain       Isolation Precautions No active isolations  Medications Medications  sodium chloride flush (NS) 0.9 % injection 3 mL (3 mLs Intravenous Not Given 03/15/19 2227)     Mobility walks with person assist Low fall risk   Focused Assessments Neuro Assessment Handoff:  Swallow screen pass? Yes    NIH Stroke Scale ( + Modified Stroke Scale Criteria)  Level of Consciousness (1a.)   : Alert, keenly responsive LOC Questions (1b. )   +: Answers both questions correctly LOC Commands (1c. )   + : Performs both tasks correctly Best Gaze (2. )  +: Normal Visual (3. )  +: No visual loss Facial Palsy (4. )    : Normal symmetrical movements Motor Arm, Left (5a. )   +: No drift Motor Arm, Right (5b. )   +: No drift Motor Leg, Left (6a. )   +: No drift Motor Leg, Right (6b. )   +: No drift Limb Ataxia (7. ): Absent Sensory (8. )   +: Normal, no sensory loss Best Language (9. )   +: No aphasia Dysarthria (10. ): Normal Extinction/Inattention (11.)   +: No Abnormality Modified SS Total  +: 0 Complete NIHSS TOTAL: 0     Neuro Assessment: Exceptions to WDL Neuro Checks:      Last Documented NIHSS Modified Score: 0 (03/15/19 2145) Has TPA been given? No If patient is a Neuro Trauma and patient is going to OR before floor call report to [place] nurse: [phone] or [phone]     R Recommendations: See Admitting Provider Note  Report given to:   Additional Notes:

## 2019-03-15 NOTE — ED Provider Notes (Signed)
[facility] EMERGENCY DEPARTMENT Provider Note   CSN: [id number] Arrival date & time: 03/15/19  2113     History   Chief Complaint Chief Complaint  Patient presents with  . Cerebrovascular Accident    HPI Jillian Guerra is a 78 y.o. female.     78yo F w/ PMH uterine cancer, HTN, HLD, GERD, DVT who p/w speech difficulty. Family reported to PCP a few day hx of speech problems that patient noticed yesterday.  She saw PCP who ordered MRI.  She was called to come back to the ED because the MRI showed a frontal lobe infarct and subacute infarcts in bilateral cerebellar hemispheres.  She denies any other complaints including no headache, vision changes, focal numbness/weakness, or changes in balance.  She has had some difficulty walking because of her significant leg swelling but this is not new.  She denies any history of stroke.  She was recently admitted to [facility] for kidney problems and since being at home has been receiving home health and physical therapy.  The history is provided by the patient.    Past Medical History:  Diagnosis Date  . Anemia   . Arthritis   . Deep vein blood clot of left lower extremity ([place])   . GERD (gastroesophageal reflux disease)   . Hypercholesteremia   . Hypertension   . Leukocytopenia    transient  . 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 Radiation 1998   hx of  . Uterine cancer [facility])     Patient Active Problem List   Diagnosis Date Noted  . DVT of leg (deep venous thrombosis) ([place]) 04/29/2011  . OTH SPEC ALVEOL&PARIETOALVEOL PNEUMONOPATHIES 11/10/2010  . ARTHRITIS 10/15/2010  . UTERINE CANCER, HX OF 10/14/2010    Past Surgical History:  Procedure Laterality Date  . APPENDECTOMY    . CHOLECYSTECTOMY    . CYSTOSCOPY/RETROGRADE/URETEROSCOPY Bilateral 05/08/2013   Procedure: CYSTOSCOPY/BLADDER BIOPSY/BILATERAL RETROGRADE PYELOGRAM;  Surgeon: Fredricka Bonine, MD;  Location: [facility];  Service: Urology;  Laterality: Bilateral;   left ureteroscpoy  . STAPEDECTOMY Bilateral [id number]  . TOTAL ABDOMINAL HYSTERECTOMY  1998     OB History   No obstetric history on file.      Home Medications    Prior to Admission medications   Medication Sig Start Date End Date Taking? Authorizing Provider  acetaminophen (TYLENOL) 325 MG tablet Take 650 mg by mouth every 6 (six) hours as needed for pain.     [provider]  aspirin EC 81 MG tablet Take 81 mg by mouth daily.    [provider]  famotidine (PEPCID) 20 MG tablet Take 20 mg by mouth 2 (two) times daily as needed for heartburn.    [provider]  HYDROcodone-acetaminophen (NORCO/VICODIN) 5-325 MG per tablet Take 1-2 tablets by mouth every 6 (six) hours as needed. 06/05/15   Horton, Barbette Hair, MD  ondansetron (ZOFRAN ODT) 4 MG disintegrating tablet Take 1 tablet (4 mg total) by mouth every 8 (eight) hours as needed for nausea or vomiting. 06/05/15   Horton, Barbette Hair, MD  simvastatin (ZOCOR) 20 MG tablet Take 20 mg by mouth at bedtime.     [provider]  triamterene-hydrochlorothiazide (MAXZIDE-25) 37.5-25 MG per tablet Take 1 tablet by mouth every morning.     [provider]    Family History Family History  Problem Relation Age of Onset  . Diabetes Sister     Social History Social History   Tobacco Use  .  Smoking status: Never Smoker  . Smokeless tobacco: Never Used  Substance Use Topics  . Alcohol use: No  . Drug use: No     Allergies   Ciprofloxacin   Review of Systems Review of Systems All other systems reviewed and are negative except that which was mentioned in HPI   Physical Exam Updated Vital Signs BP (!) 113/56 (BP Location: Right Arm)   Pulse 68   Temp 98.4 F (36.9 C) (Oral)   Resp 18   Ht 5\' 3"  (1.6 m)   Wt 90.7 kg   SpO2 99%   BMI 35.43 kg/m   Physical Exam Vitals signs and nursing note reviewed.  Constitutional:      General: She is not in acute distress.    Appearance:  She is well-developed.     Comments: Awake, alert  HENT:     Head: Normocephalic and atraumatic.     Nose: Nose normal.     Mouth/Throat:     Mouth: Mucous membranes are moist.     Pharynx: Oropharynx is clear.  Eyes:     Extraocular Movements: Extraocular movements intact.     Conjunctiva/sclera: Conjunctivae normal.     Pupils: Pupils are equal, round, and reactive to light.  Neck:     Musculoskeletal: Neck supple.  Cardiovascular:     Rate and Rhythm: Normal rate and regular rhythm.     Heart sounds: Normal heart sounds. No murmur.  Pulmonary:     Effort: Pulmonary effort is normal. No respiratory distress.     Breath sounds: Normal breath sounds.  Abdominal:     General: Bowel sounds are normal. There is no distension.     Palpations: Abdomen is soft.     Tenderness: There is no abdominal tenderness.  Skin:    General: Skin is warm and dry.  Neurological:     Mental Status: She is alert and oriented to person, place, and time.     Cranial Nerves: No cranial nerve deficit.     Motor: No abnormal muscle tone.     Deep Tendon Reflexes: Reflexes are normal and symmetric.     Comments: Very slight word finding difficulty, normal finger-to-nose testing, negative pronator drift, no clonus 5/5 strength and normal sensation x all 4 extremities  Psychiatric:        Thought Content: Thought content normal.        Judgment: Judgment normal.      ED Treatments / Results  Labs (all labs ordered are listed, but only abnormal results are displayed) Labs Reviewed  I-STAT CHEM 8, ED - Abnormal; Notable for the following components:      Result Value   BUN 50 (*)    Creatinine, Ser 2.30 (*)    Glucose, Bld 147 (*)    Calcium, Ion 1.12 (*)    Hemoglobin 9.2 (*)    HCT 27.0 (*)    All other components within normal limits  PROTIME-INR  APTT  CBC  DIFFERENTIAL  COMPREHENSIVE METABOLIC PANEL  CBG MONITORING, ED    EKG EKG Interpretation  Date/Time:  Thursday March 15 2019  21:23:04 EDT Ventricular Rate:  88 PR Interval:    QRS Duration: 74 QT Interval:  368 QTC Calculation: 445 R Axis:   7 Text Interpretation:  Sinus rhythm with occasional Premature ventricular complexes and Premature atrial complexes Cannot rule out Anterior infarct , age undetermined Abnormal ECG No previous ECGs available Confirmed by Theotis Burrow [phone]) on 03/15/2019 9:36:29 PM  Radiology No results found.  Procedures Procedures (including critical care time)  Medications Ordered in ED Medications  sodium chloride flush (NS) 0.9 % injection 3 mL (has no administration in time range)     Initial Impression / Assessment and Plan / ED Course  I have reviewed the triage vital signs and the nursing notes.  Pertinent labs & imaging results that were available during my care of the patient were reviewed by me and considered in my medical decision making (see chart for details).        Report from OSH: MRI brain without contrast:  INDICATION: Disoriented   TECHNIQUE: Imaging of the brain was performed in 3 planes utilizing a combination of T1, FLAIR, and T2 weighting. Diffusion images were performed.   COMPARISON: CT head 11/13/2016.  FINDINGS: # Ventricles: Normal without dilatation, mass effect, or midline shift. # Brain parenchyma: There is a focus of diffusion restriction involving the left posterior frontal lobe consistent with acute infarction. Small foci of diffusion restriction are noted in the cerebellar hemispheres bilaterally. These demonstrate mild  increased T2 signal intensity and are consistent with acute/subacute infarcts. There are foci of increased T2 signal intensity in the central pons and white matter bilaterally, likely chronic or microvascular ischemic changes or old white matter  infarcts. No evidence mass or hemorrhage.  # Sella and parasellar region: Normal. # Craniovertebral junction: Normal. # Mastoids and paranasal sinuses: Unremarkable #  Orbits: Unremarkable # Major cerebral vessels and dural sinuses: Normal flow voids # Bony structures and scalp: Unremarkable   IMPRESSION: Acute infarction left posterior frontal lobe.  Slightly older acute/subacute infarcts in the cerebellar hemispheres bilaterally.    Patient awake, alert, comfortable on exam with reassuring vital signs.  She had mild word finding difficulty but otherwise reassuring exam.  Significant lower extremity edema is chronic for her.  Reviewed imaging and discussed with Dr. Cheral Marker, neurologist, who will see in consultation.  Discussed admission with [facility], Dr. Posey Pronto, and patient admitted for further care. Final Clinical Impressions(s) / ED Diagnoses   Final diagnoses:  None    ED Discharge Orders    None       Little, Wenda Overland, MD 03/15/19 2324

## 2019-03-15 NOTE — H&P (Signed)
- History and Physical    Jillian Guerra WPY:[id number] DOB: 01/23/1941 DOA: 03/15/2019  PCP: Curly Rim, MD  Patient coming from: Home  I have personally briefly reviewed patient's old medical records in [place]  Chief Complaint: Stroke  HPI: Jillian Guerra is a 78 y.o. 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  Patient developed word finding difficulty intermittently yesterday and was seen by her PCP on 03/15/2019 for further evaluation.  She had no other symptoms including focal numbness/weakness, change in vision, headache, gait instability, chest pain, dyspnea, palpitations, or change in memory.  An MRI brain was obtained which revealed an acute infarct of the left posterior frontal lobe with acute/subacute infarcts in the cerebellar hemispheres bilaterally (radiology report available in care everywhere).  She has no prior known history of stroke.  She has a chronic indwelling Foley catheter which was exchanged this morning.  ED Course:  Initial vitals showed BP 120/57, pulse 77, RR 21, temp 98.4 Fahrenheit, SPO2 100% on room air.  Labs are notable for WBC 6.6, hemoglobin 8.8, platelets 231,000, potassium 4.0, BUN 42, creatinine 2.35.  SARS-CoV-2 test was obtained in process.    Neurology were consulted for further evaluation/recommendations and the hospitalist service was consulted to admit.   Review of Systems: All systems reviewed and are negative except as documented in history of present illness above.   Past Medical History:  Diagnosis Date  . Anemia   . Arthritis   . Deep  vein blood clot of left lower extremity ([place])   . GERD (gastroesophageal reflux disease)   . Hypercholesteremia   . Hypertension   . Leukocytopenia    transient  . 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 Radiation 1998   hx of  . Uterine cancer [facility])     Past Surgical History:  Procedure Laterality Date  . APPENDECTOMY    . CHOLECYSTECTOMY    . CYSTOSCOPY/RETROGRADE/URETEROSCOPY Bilateral 05/08/2013   Procedure: CYSTOSCOPY/BLADDER BIOPSY/BILATERAL RETROGRADE PYELOGRAM;  Surgeon: Fredricka Bonine, MD;  Location: [facility];  Service: Urology;  Laterality: Bilateral;  left ureteroscpoy  . STAPEDECTOMY Bilateral [id number]  . TOTAL ABDOMINAL HYSTERECTOMY  1998    Social History:  reports that she has never smoked. She has never used smokeless tobacco. She reports that she does not drink alcohol or use drugs.  Allergies  Allergen Reactions  . Ciprofloxacin     Family History  Problem Relation Age of Onset  . Diabetes Sister   . Stroke Father      Prior to Admission medications   Medication Sig Start Date End Date Taking? Authorizing Provider  acetaminophen (TYLENOL) 325 MG tablet Take 650 mg by mouth every 6 (six) hours as needed for pain.     [provider]  aspirin EC 81 MG tablet Take 81 mg by mouth daily.    [provider]  famotidine (PEPCID) 20 MG tablet Take 20 mg by mouth 2 (two) times daily as needed for heartburn.    [provider]  HYDROcodone-acetaminophen (NORCO/VICODIN) 5-325 MG per tablet Take 1-2 tablets by mouth every 6 (six) hours as needed. 06/05/15   Horton, Barbette Hair, MD  ondansetron (ZOFRAN ODT) 4 MG disintegrating tablet Take 1 tablet (4 mg total) by mouth every 8 (eight) hours as needed for nausea or vomiting. 06/05/15   Horton, Barbette Hair, MD  simvastatin (ZOCOR) 20 MG tablet Take 20 mg by mouth at bedtime.     [provider]  triamterene-hydrochlorothiazide (MAXZIDE-25) 37.5-25 MG per tablet Take 1 tablet by mouth every  morning.     [provider]    Physical Exam: Vitals:   03/15/19 2125 03/15/19 2153 03/15/19 2200 03/15/19 2300  BP: (!) 113/56 (!) 120/57 (!) 107/57 (!) 104/55  Pulse: 68 71 63   Resp: 18 (!) 21 16 16   Temp: 98.4 F (36.9 C)     TempSrc: Oral     SpO2: 99% 100% 93%   Weight:      Height:        Constitutional: Resting supine in bed, NAD, calm, comfortable Eyes: PERRL, EOMI, lids and conjunctivae normal ENMT: Mucous membranes are moist. Posterior pharynx clear of any exudate or lesions.Normal dentition.  Neck: normal, supple, no masses. Respiratory: clear to auscultation bilaterally, no wheezing, no crackles. Normal respiratory effort. No accessory muscle use.  Cardiovascular: Regular rate and rhythm, no murmurs / rubs / gallops.  +1-2 bilateral lower extremity edema. 2+ pedal pulses. Abdomen: Obese abdomen, no tenderness, no masses palpated. No hepatosplenomegaly.  Musculoskeletal: no clubbing / cyanosis. No joint deformity upper and lower extremities. ROM of bilateral lower extremities limited due to body habitus otherwise intact, no contractures. Normal muscle tone.  Skin: no rashes, lesions, ulcers. No induration Neurologic: CN 2-12 grossly intact. Sensation intact, Strength 5/5 in all 4.  Psychiatric: Normal judgment and insight. Alert and oriented x 3. Normal mood.   Labs on Admission: I have personally reviewed following labs and imaging studies  CBC: Recent Labs  Lab 03/15/19 2127 03/15/19 2140  WBC 6.6  --   NEUTROABS 4.2  --   HGB 8.8* 9.2*  HCT 28.9* 27.0*  MCV 105.9*  --   PLT 231  --    Basic Metabolic Panel: Recent Labs  Lab 03/15/19 2127 03/15/19 2140  NA 140 138  K 4.0 4.2  CL 104 103  CO2 25  --   GLUCOSE 152* 147*  BUN 42* 50*  CREATININE 2.35* 2.30*  CALCIUM 9.1  --    GFR: Estimated Creatinine Clearance: 22.2 mL/min (A) (by C-G formula based on SCr of 2.3 mg/dL (H)). Liver Function Tests: Recent Labs  Lab 03/15/19 2127  AST  19  ALT 14  ALKPHOS 86  BILITOT 0.4  PROT 6.0*  ALBUMIN 2.5*   No results for input(s): LIPASE, AMYLASE in the last 168 hours. No results for input(s): AMMONIA in the last 168 hours. Coagulation Profile: Recent Labs  Lab 03/15/19 2127  INR 0.9   Cardiac Enzymes: No results for input(s): CKTOTAL, CKMB, CKMBINDEX, TROPONINI in the last 168 hours. BNP (last 3 results) No results for input(s): PROBNP in the last 8760 hours. HbA1C: No results for input(s): HGBA1C in the last 72 hours. CBG: Recent Labs  Lab 03/15/19 2158  GLUCAP 136*   Lipid Profile: No results for input(s): CHOL, HDL, LDLCALC, TRIG, CHOLHDL, LDLDIRECT in the last 72 hours. Thyroid Function Tests: No results for input(s): TSH, T4TOTAL, FREET4, T3FREE, THYROIDAB in the last 72 hours. Anemia Panel: No results for input(s): VITAMINB12, FOLATE, FERRITIN, TIBC, IRON, RETICCTPCT in the last 72 hours. Urine analysis:    Component Value Date/Time   COLORURINE YELLOW 06/05/2015 0715  APPEARANCEUR CLOUDY (A) 06/05/2015 0715   LABSPEC 1.012 06/05/2015 0715   PHURINE 6.5 06/05/2015 0715   GLUCOSEU NEGATIVE 06/05/2015 0715   HGBUR LARGE (A) 06/05/2015 0715   BILIRUBINUR NEGATIVE 06/05/2015 0715   KETONESUR NEGATIVE 06/05/2015 0715   PROTEINUR 100 (A) 06/05/2015 0715   UROBILINOGEN 0.2 06/05/2015 0715   NITRITE POSITIVE (A) 06/05/2015 0715   LEUKOCYTESUR LARGE (A) 06/05/2015 0715    Radiological Exams on Admission: No results found.  EKG: Independently reviewed.  Sinus arrhythmia with PACs, low voltage, no acute ischemic changes, no prior for comparison.  Assessment/Plan Principal Problem:   Acute CVA (cerebrovascular accident) ([place]) Active Problems:   CKD (chronic kidney disease), stage IV (H[facility])   Hyperlipidemia   Urinary retention  Jillian Guerra is a 78 y.o. 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   Acute posterior frontal lobe infarct/subacute bilateral cerebellar infarcts: Seen on MRI brain at outside hospital (report available in care everywhere). -Admit to telemetry -Aspirin 81 mg daily -Switch from simvastatin to atorvastatin 40 mg daily -Obtain carotid Dopplers and echocardiogram -PT/OT/SLP eval -Check hemoglobin A1c and lipid panel -Will allow for permissive hypertension for now -Neurology consulted  CKD stage IV: Secondary to obstructive uropathy and suspected FSGS.  Follows with nephrology, Dr. Rogers Blocker, in [place]. -Appears to be stable at this time, continue to monitor -Avoid NSAIDs and nephrotoxic agents  Urinary retention with chronic indwelling Foley: Chronic urinary retention reportedly current after pelvic radiation for prior endometrial cancer.  Foley catheter in place last exchange 03/15/2019 per patient.  Continue Foley care.  Anemia of chronic disease: Currently stable without obvious bleeding.  Continue to monitor.  Hyperlipidemia: Switch home simvastatin to atorvastatin as above.   DVT prophylaxis: Subcutaneous heparin Code Status: DNR, confirmed with patient Family Communication: None present on admission Disposition Plan: Pending stroke work-up Consults called: Neurology Admission status: Inpatient, patient likely requires greater than 2 midnight length stay for further evaluation and management of acute stroke.   Zada Finders MD [facility]  If 7PM-7AM, please contact night-coverage [email or web address]  03/15/2019, 11:50 PM

## 2019-03-15 NOTE — ED Notes (Signed)
Pt has sinus arrhythmia on monitor

## 2019-03-15 NOTE — ED Notes (Signed)
Neurology at bedside.

## 2019-03-16 ENCOUNTER — Inpatient Hospital Stay (HOSPITAL_COMMUNITY): Payer: Medicare Other

## 2019-03-16 DIAGNOSIS — I639 Cerebral infarction, unspecified: Secondary | ICD-10-CM

## 2019-03-16 DIAGNOSIS — R339 Retention of urine, unspecified: Secondary | ICD-10-CM

## 2019-03-16 DIAGNOSIS — N184 Chronic kidney disease, stage 4 (severe): Secondary | ICD-10-CM

## 2019-03-16 DIAGNOSIS — E785 Hyperlipidemia, unspecified: Secondary | ICD-10-CM

## 2019-03-16 LAB — LIPID PANEL
Cholesterol: 114 mg/dL (ref 0–200)
HDL: 57 mg/dL (ref >40)
LDL Cholesterol: 45 mg/dL (ref 0–99)
Total CHOL/HDL Ratio: 2 RATIO
Triglycerides: 61 mg/dL (ref <150)
VLDL: 12 mg/dL (ref 0–40)

## 2019-03-16 LAB — HEMOGLOBIN A1C
Hgb A1c MFr Bld: 5.3 % (ref 4.8–5.6)
Mean Plasma Glucose: 105.41 mg/dL

## 2019-03-16 LAB — ECHOCARDIOGRAM COMPLETE
Height: 63 in
Weight: 3200 oz

## 2019-03-16 LAB — SARS CORONAVIRUS 2: SARS Coronavirus 2: NOT DETECTED

## 2019-03-16 MED ORDER — CLOPIDOGREL BISULFATE 75 MG PO TABS
75.0000 mg | ORAL_TABLET | Freq: Every day | ORAL | Status: DC
  Administered 2019-03-16 – 2019-03-19 (×4): 75 mg via ORAL
  Filled 2019-03-16 (×4): qty 1

## 2019-03-16 NOTE — TOC Initial Note (Signed)
Transition of Care [facility]) - Initial/Assessment Note    Patient Details  Name: Jillian Guerra MRN: [id number] Date of Birth: 04-13-1941  [facility]) CM/SW Contact:    Pollie Friar, RN Phone Number: 03/16/2019, 4:53 PM  Clinical Narrative:                 Pt denies issues with transportation or her home meds.  Recommendations are for [facility] services. Pt would like to use [facility]. TOC will reach out to them for referral.  Expected Discharge Plan: [place] Barriers to Discharge: Continued Medical Work up   Patient Goals and CMS Choice   CMS Medicare.gov Compare Post Acute Care list provided to:: Patient Choice offered to / list presented to : Patient  Expected Discharge Plan and Services Expected Discharge Plan: [place]   Discharge Planning Services: CM Consult Post Acute Care Choice: [facility] arrangements for the past 2 months: Single Family Home                                      Prior Living Arrangements/Services Living arrangements for the past 2 months: Single Family Home Lives with:: Adult Children(daughter and son in [place]) Patient language and need for interpreter reviewed:: Yes(no needs) Do you feel safe going back to the place where you live?: Yes      Need for Family Participation in Patient Care: Yes (Comment) Care giver support system in place?: Yes (comment)(has 24 hour supervision at home)   Criminal Activity/Legal Involvement Pertinent to Current Situation/Hospitalization: No - Comment as needed  Activities of Daily Living      Permission Sought/Granted                  Emotional Assessment Appearance:: Appears stated age Attitude/Demeanor/Rapport: Engaged Affect (typically observed): Accepting, Calm, Appropriate Orientation: : Oriented to Self, Oriented to Place, Oriented to  Time   Psych Involvement: No (comment)  Admission diagnosis:  Stroke Patient Active Problem List   Diagnosis Date Noted  . Acute CVA (cerebrovascular accident) ([place]) 03/15/2019  . Hyperlipidemia 02/14/2019  . CKD (chronic kidney disease), stage IV ([place]) 06/11/2014  . Urinary retention 06/11/2014  . DVT of leg (deep venous thrombosis) ([place]) 04/29/2011  . OTH SPEC ALVEOL&PARIETOALVEOL PNEUMONOPATHIES 11/10/2010  . ARTHRITIS 10/15/2010  . UTERINE CANCER, HX OF 10/14/2010   PCP:  Curly Rim, MD Pharmacy:   [id number] [place], [address] [place] [address] Phone: [phone] Fax: [phone]     Social Determinants of Health (SDOH) Interventions    Readmission Risk Interventions No flowsheet data found.

## 2019-03-16 NOTE — Progress Notes (Addendum)
PROGRESS NOTE  Jillian Guerra RJJ:[id number] DOB: 02/18/1941 DOA: 03/15/2019 PCP: Curly Rim, MD   LOS: 1 day   Brief narrative: Jillian Guerra is a 78 y.o. 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 Patient developed word finding difficulty intermittently for 1 day and was seen by her PCP on 03/15/2019 for further evaluation.   An MRI brain was obtained which revealed an acute infarct of the left posterior frontal lobe with acute/subacute infarcts in the cerebellar hemispheres bilaterally (radiology report available in care everywhere).    Subjective: Patient has mild difficulty expressing.  Denies any dizziness, lightheadedness, headache, nausea or vomiting.  Is little hard of hearing.  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  Seen by neurology in the hospital.  Duplex shows less than 39% stenosis bilaterally.  Transcranial Doppler was limited.  Hemoglobin A1c of 5.3.  On aspirin 81 mg.  Neurology recommended adding Plavix at 5 mg daily for 3 weeks then Plavix alone.  PT has recommended home health PT.  Will check 2D echocardiogram.  If 2D echocardiogram is negative patient will likely need TEE and cardiac monitoring with loop recorder.  Will need to rule out embolic CVA.  Patient has been seen by speech therapy and  continued speech therapy on discharge.  CKD stage IV peripheral edema: Secondary to obstructive uropathy and suspected FSGS.  Follows with nephrology, Dr. Rogers Blocker, in [place].  Will follow creatinine levels.  Patient will need to follow-up with nephrology as outpatient.  Urinary retention with chronic indwelling Foley: Chronic urinary retention reportedly current after pelvic radiation for prior endometrial cancer.  Foley catheter in place last exchange 03/15/2019 per patient.  Continue Foley care.  Anemia of chronic disease: Stable.  No evidence of bleeding.  Hyperlipidemia: We will change simvastatin to atorvastatin on discharge.  VTE Prophylaxis: Heparin  Code Status: DNR  Family Communication: I spoke with the patient's daughter black on the phone and updated her about the clinical condition of the patient and the plan for further testing including possible TEE  Disposition Plan: Pending work-up, physical therapy recommend home health PT.  Addendum:  03/16/2019 2:50 PM   Noted a 2D echocardiogram without mention of any thrombus.  I left message on the cardiology master requesting TEE.  Consultants:  Neurology  Procedures:  None  Antibiotics: Anti-infectives (From admission, onward)   None      Objective: Vitals:   03/16/19 0830 03/16/19 1203  BP: (!) 90/51 (!) 92/42  Pulse: 63 (!) 59  Resp:  20  Temp:  97.7 F (36.5 C)  SpO2:  100%    Intake/Output Summary (Last 24 hours) at 03/16/2019 1344 Last data filed at 03/16/2019 1016 Gross per 24 hour  Intake 3 ml  Output 250 ml  Net -247 ml   Filed Weights   03/15/19 2123  Weight: 90.7 kg   Body mass index is 35.43 kg/m.   Physical Exam:  GENERAL: Patient is alert awake and communicative.  Hard of hearing.. Not in obvious distress.  Morbidly obese.  Delayed response. HENT: No scleral pallor or icterus. Pupils equally reactive to light. Oral mucosa is moist.  . NECK: is supple, no palpable  thyroid enlargement. CHEST: Clear to auscultation. No crackles or wheezes. Non tender on palpation. Diminished breath sounds bilaterally. CVS: S1 and S2 heard, no murmur. Regular rate and rhythm. No pericardial rub. ABDOMEN: Soft, non-tender, bowel sounds are present. No palpable hepato-splenomegaly. EXTREMITIES: No edema. CNS: Cranial nerves are intact.  Oriented.  Moves all extremities.  No focal weakness. SKIN: warm and dry without rashes.  Data Review: I have personally reviewed the following laboratory data and studies,  CBC: Recent Labs  Lab 03/15/19 2127 03/15/19 2140  WBC 6.6  --   NEUTROABS 4.2  --   HGB 8.8* 9.2*  HCT 28.9* 27.0*  MCV 105.9*  --   PLT 231  --    Basic Metabolic Panel: Recent Labs  Lab 03/15/19 2127 03/15/19 2140  NA 140 138  K 4.0 4.2  CL 104 103  CO2 25  --   GLUCOSE 152* 147*  BUN 42* 50*  CREATININE 2.35* 2.30*  CALCIUM 9.1  --    Liver Function Tests: Recent Labs  Lab 03/15/19 2127  AST 19  ALT 14  ALKPHOS 86  BILITOT 0.4  PROT 6.0*  ALBUMIN 2.5*   No results for input(s): LIPASE, AMYLASE in the last 168 hours. No results for input(s): AMMONIA in the last 168 hours. Cardiac Enzymes: No results for input(s): CKTOTAL, CKMB, CKMBINDEX, TROPONINI in the last 168 hours. BNP (last 3 results) No results for input(s): BNP in the last 8760 hours.  ProBNP (last 3 results) No results for input(s): PROBNP in the last 8760 hours.  CBG: Recent Labs  Lab 03/15/19 2158  GLUCAP 136*   Recent Results (from the past 240 hour(s))  SARS Coronavirus 2     Status: None   Collection Time: 03/15/19 10:04 PM  Result Value Ref Range Status   SARS Coronavirus 2 NOT DETECTED NOT DETECTED Final    Comment: (NOTE) SARS-CoV-2 target nucleic acids are NOT DETECTED. The SARS-CoV-2 RNA is generally detectable in upper and lower respiratory specimens during the acute phase of infection.  Negative  results do not preclude SARS-CoV-2 infection, do not  rule out co-infections with other pathogens, and should not be used as the sole basis for treatment or other patient management decisions.  Negative results must be combined with clinical observations, patient history, and epidemiological information. The expected result is Not Detected. Fact Sheet for Patients: http://www.biofiredefense.com/wp-content/uploads/2020/03/BIOFIRE-COVID -19-patients.pdf Fact Sheet for Healthcare Providers: http://www.biofiredefense.com/wp-content/uploads/2020/03/BIOFIRE-COVID -19-hcp.pdf This test is not yet approved or cleared by the [place] and  has been authorized for detection and/or diagnosis of SARS-CoV-2 by FDA under an Emergency Use Authorization (EUA).  This EUA will remain in effec t (meaning this test can be used) for the duration of  the COVID-19 declaration under Section 564(b)(1) of the Act, 21 U.S.C. section 360bbb-3(b)(1), unless the authorization is terminated or revoked sooner. Performed at [facility], [place] [address] [place], [place] [address]      Studies: Vas US Carotid (at [place] Only)  Result Date: 03/16/2019 Carotid Arterial Duplex Study Indications:       CVA. Risk Factors:      Hypertension. Comparison Study:  no prior study Performing Technologist: Abram Sander RVS  Examination Guidelines: A complete evaluation  includes B-mode imaging, spectral Doppler, color Doppler, and power Doppler as needed of all accessible portions of each vessel. Bilateral testing is considered an integral part of a complete examination. Limited examinations for reoccurring indications may be performed as noted.  Right Carotid Findings: +----------+--------+--------+--------+-----------+--------+           PSV cm/sEDV cm/sStenosisDescribe   Comments +----------+--------+--------+--------+-----------+--------+ CCA Prox  60      10              homogeneous         +----------+--------+--------+--------+-----------+--------+  CCA Distal73      17              homogeneous         +----------+--------+--------+--------+-----------+--------+ ICA Prox  63      11      1-39%   homogeneous         +----------+--------+--------+--------+-----------+--------+ ICA Distal75      25                                  +----------+--------+--------+--------+-----------+--------+ ECA       103                                         +----------+--------+--------+--------+-----------+--------+ +----------+--------+-------+--------+-------------------+           PSV cm/sEDV cmsDescribeArm Pressure (mmHG) +----------+--------+-------+--------+-------------------+ [id number]                                        +----------+--------+-------+--------+-------------------+ +---------+--------+--+--------+--+---------+ VertebralPSV cm/s52EDV cm/s11Antegrade +---------+--------+--+--------+--+---------+  Left Carotid Findings: +----------+--------+--------+--------+-----------+---------------------------+           PSV cm/sEDV cm/sStenosisDescribe   Comments                    +----------+--------+--------+--------+-----------+---------------------------+ CCA Prox  93      23              homogeneous                            +----------+--------+--------+--------+-----------+---------------------------+ CCA Distal106     23              homogeneous                            +----------+--------+--------+--------+-----------+---------------------------+ ICA Prox                  1-39%   homogeneousvelocity image not captured +----------+--------+--------+--------+-----------+---------------------------+ ICA Distal75      21                                                     +----------+--------+--------+--------+-----------+---------------------------+ ECA       75                                                              +----------+--------+--------+--------+-----------+---------------------------+ +----------+--------+--------+--------+-------------------+  SubclavianPSV cm/sEDV cm/sDescribeArm Pressure (mmHG) +----------+--------+--------+--------+-------------------+           82                                          +----------+--------+--------+--------+-------------------+ +---------+--------+--+--------+--+ VertebralPSV cm/s55EDV cm/s10 +---------+--------+--+--------+--+  Summary: Right Carotid: Velocities in the right ICA are consistent with a 1-39% stenosis. Left Carotid: Velocities in the left ICA are consistent with a 1-39% stenosis. Vertebrals: Bilateral vertebral arteries demonstrate antegrade flow. *See table(s) above for measurements and observations.  Electronically signed by Antony Contras MD on 03/16/2019 at 10:45:34 AM.    Final    Vas [place] Transcranial Doppler  Result Date: 03/16/2019  Transcranial Doppler Indications: Stroke. Limitations for diagnostic windows: Unable to insonate right transtemporal window. Unable to insonate left transtemporal window. Performing Technologist: Abram Sander RVS  Examination Guidelines: A complete evaluation includes B-mode imaging, spectral Doppler, color Doppler, and power Doppler as needed of all accessible portions of each vessel. Bilateral testing is considered an integral part of a complete examination. Limited examinations for reoccurring indications may be performed as noted.  +----------+-------------+----------+-----------+-------+ RIGHT TCD Right VM (cm)Depth (cm)PulsatilityComment +----------+-------------+----------+-----------+-------+ Opthalmic     24.00                 1.71            +----------+-------------+----------+-----------+-------+ ICA siphon    43.00                 1.47            +----------+-------------+----------+-----------+-------+  +----------+------------+----------+-----------+-------+ LEFT TCD  Left VM  (cm)Depth (cm)PulsatilityComment +----------+------------+----------+-----------+-------+ Opthalmic    23.00                 1.79            +----------+------------+----------+-----------+-------+ ICA siphon   44.00                                 +----------+------------+----------+-----------+-------+  Summary:  Absent bitemporal and occipital windows greatly limit exam.Normal blood flow directionsand velocities in both opthalmics and carotid siphons *See table(s) above for measurements and observations.  Diagnosing physician: Antony Contras MD Electronically signed by Antony Contras MD on 03/16/2019 at 10:47:25 AM.    Final     Scheduled Meds: . aspirin EC  81 mg Oral Daily  . atorvastatin  40 mg Oral q1800  . clopidogrel  75 mg Oral Daily  . heparin  5,000 Units Subcutaneous Q8H    Continuous Infusions:   Flora Lipps, MD  [facility] 03/16/2019

## 2019-03-16 NOTE — Progress Notes (Signed)
2D Echocardiogram has been performed.  Jillian Guerra 03/16/2019, 9:37 AM

## 2019-03-16 NOTE — Progress Notes (Signed)
SLP Cancellation Note  Patient Details Name: LYDIA TOREN MRN: [id number] DOB: 01/28/1941   Cancelled treatment:       Reason Eval/Treat Not Completed: Patient at procedure or test/unavailable(SLP will re-attempt later)  Tobie Poet I. Hardin Negus, [place], [place] Office number [phone] Pager [place] 03/16/2019, 9:07 AM

## 2019-03-16 NOTE — Consult Note (Addendum)
Referring Physician: Dr. Rex Kras    Chief Complaint: Word finding difficulty  HPI: Jillian Guerra is an 78 y.o. female with a complex PMHx who presented to the ED on Thursday night for evaluation of stroke. She had been having difficulty with word-finding for a few days. She had an MRI at OSH which revealed an acute left posterior frontal lobe infarct and subacute bilateral cerebellar infarctions; her PCP then told her to come to the ED for stroke evaluation. She has no prior history of stroke.  She has had no vision changes, focal weakness, limb numbness, balance disturbance or headache. Over an extended period of time, she has had difficulty ambulating due to BLE swelling. Also with no SOB or CP.   Her PMHx includes uterine CA, HTN, HLD and DVT.    EKG:  Sinus rhythm with occasional premature ventricular complexes and premature atrial complexes. Cannot rule out anterior infarct, age undetermined.    Past Medical History:  Diagnosis Date  . Anemia   . Arthritis   . Deep vein blood clot of left lower extremity ([place])   . GERD (gastroesophageal reflux disease)   . Hypercholesteremia   . Hypertension   . Leukocytopenia    transient  . 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 Radiation 1998   hx of  . Uterine cancer [facility])     Past Surgical History:  Procedure Laterality Date  . APPENDECTOMY    . CHOLECYSTECTOMY    . CYSTOSCOPY/RETROGRADE/URETEROSCOPY Bilateral 05/08/2013   Procedure: CYSTOSCOPY/BLADDER BIOPSY/BILATERAL RETROGRADE PYELOGRAM;  Surgeon: Fredricka Bonine, MD;  Location: [facility];  Service: Urology;  Laterality: Bilateral;  left ureteroscpoy  . STAPEDECTOMY Bilateral [id number]  . TOTAL ABDOMINAL HYSTERECTOMY  1998    Family History  Problem Relation Age of Onset  . Diabetes Sister   . Stroke Father    Social History:  reports that she has never smoked. She has never used smokeless tobacco. She reports that she does not drink alcohol or use drugs.  Allergies:  Allergies   Allergen Reactions  . Ciprofloxacin     Medications:  Tylenol  Pepcid Norco Zofran Zocor Maxzide ASA   ROS: As per HPI, with all other systems negative.   Physical Examination: Blood pressure 122/61, pulse 62, temperature 98 F (36.7 C), temperature source Oral, resp. rate 15, height 5\' 3"  (1.6 m), weight 90.7 kg, SpO2 100 %.  HEENT: [place]/AT Lungs: Respirations unlabored Ext: Prominent swelling of lower extremities distal to the knees with pitting edema.   Neurologic Examination: Mental Status: Alert, fully oriented, thought content appropriate.  Speech with mild ataxic quality. Also with occasional halting during speech to find words, but no errors of grammar or syntax consistent with intact fluency. Naming intact after some pauses to search for words. Repetition intact. Able to comprehend a two step directional command but only after significant delay.  Cranial Nerves: II:  Visual fields intact with no extinction to DSS. PERRL.  III,IV, VI: No ptosis.  EOMI without nystagmus.  V,VII: No facial droop. Temp sensation equal bilaterally.  VIII: hearing intact to voice IX,X: no hypophonia XI: Symmetric XII: midline tongue extension  Motor: RUE: 4/5 biceps, triceps and deltoid with 4+/5 grip.  LUE: 5/5 RLE: Compromised by increased weight of the limb in the context of distal swelling. Ankle dorsiflexion 4/5, plantar flexion 5/5 LLE: Compromised by swelling; ADF/APF 5/5 No pronator drift.  Sensory: Temp and light touch intact in upper extremities without extinction. Decreased sensation in regions of lower extremity  swelling.  Deep Tendon Reflexes:  2+ bilateral upper ext. 0 patellae bilaterally Plantars: Right: downgoing   Left: downgoing Cerebellar: No ataxia with FNF bilaterally. RAM with dysmetria bilaterally. Unable to perform H-S.  Gait: Deferred  Results for orders placed or performed during the hospital encounter of 03/15/19 (from the past 48 hour(s))  Protime-INR      Status: None   Collection Time: 03/15/19  9:27 PM  Result Value Ref Range   Prothrombin Time 12.4 11.4 - 15.2 seconds   INR 0.9 0.8 - 1.2    Comment: (NOTE) INR goal varies based on device and disease states. Performed at [facility], [place] [address], [place], [place] [address]   APTT     Status: None   Collection Time: 03/15/19  9:27 PM  Result Value Ref Range   aPTT 35 24 - 36 seconds    Comment: Performed at [place] [address], [place], [place] [address]  CBC     Status: Abnormal   Collection Time: 03/15/19  9:27 PM  Result Value Ref Range   WBC 6.6 4.0 - 10.5 K/uL   RBC 2.73 (L) 3.87 - 5.11 MIL/uL   Hemoglobin 8.8 (L) 12.0 - 15.0 g/dL   HCT 28.9 (L) 36.0 - 46.0 %   MCV 105.9 (H) 80.0 - 100.0 fL   MCH 32.2 26.0 - 34.0 pg   MCHC 30.4 30.0 - 36.0 g/dL   RDW 14.8 11.5 - 15.5 %   Platelets 231 150 - 400 K/uL   nRBC 0.0 0.0 - 0.2 %    Comment: Performed at [facility], [place] [address] [place], [place] [address]  Differential     Status: None   Collection Time: 03/15/19  9:27 PM  Result Value Ref Range   Neutrophils Relative % 64 %   Neutro Abs 4.2 1.7 - 7.7 K/uL   Lymphocytes Relative 27 %   Lymphs Abs 1.8 0.7 - 4.0 K/uL   Monocytes Relative 7 %   Monocytes Absolute 0.5 0.1 - 1.0 K/uL   Eosinophils Relative 1 %   Eosinophils Absolute 0.1 0.0 - 0.5 K/uL   Basophils Relative 1 %   Basophils Absolute 0.0 0.0 - 0.1 K/uL   Immature Granulocytes 0 %   Abs Immature Granulocytes 0.02 0.00 - 0.07 K/uL    Comment: Performed at [facility], [place] [address], [place], [place] [address]  Comprehensive metabolic panel     Status: Abnormal   Collection Time: 03/15/19  9:27 PM  Result Value Ref Range   Sodium 140 135 - 145 mmol/L   Potassium 4.0 3.5 - 5.1 mmol/L   Chloride 104 98 - 111 mmol/L   CO2 25 22 - 32 mmol/L   Glucose, Bld 152 (H) 70 - 99 mg/dL   BUN 42 (H) 8 - 23 mg/dL   Creatinine, Ser 2.35 (H) 0.44 - 1.00 mg/dL   Calcium 9.1 8.9 -  10.3 mg/dL   Total Protein 6.0 (L) 6.5 - 8.1 g/dL   Albumin 2.5 (L) 3.5 - 5.0 g/dL   AST 19 15 - 41 U/L   ALT 14 0 - 44 U/L   Alkaline Phosphatase 86 38 - 126 U/L   Total Bilirubin 0.4 0.3 - 1.2 mg/dL   GFR calc non Af Amer 19 (L) >60 mL/min   GFR calc Af Amer 23 (L) >60 mL/min   Anion gap 11 5 - 15    Comment: Performed at  [facility], [place] [address] [place], [place] [address]  I-stat chem 8, ED     Status: Abnormal   Collection Time: 03/15/19  9:40 PM  Result Value Ref Range   Sodium 138 135 - 145 mmol/L   Potassium 4.2 3.5 - 5.1 mmol/L   Chloride 103 98 - 111 mmol/L   BUN 50 (H) 8 - 23 mg/dL   Creatinine, Ser 2.30 (H) 0.44 - 1.00 mg/dL   Glucose, Bld 147 (H) 70 - 99 mg/dL   Calcium, Ion 1.12 (L) 1.15 - 1.40 mmol/L   TCO2 26 22 - 32 mmol/L   Hemoglobin 9.2 (L) 12.0 - 15.0 g/dL   HCT 27.0 (L) 36.0 - 46.0 %  CBG monitoring, ED     Status: Abnormal   Collection Time: 03/15/19  9:58 PM  Result Value Ref Range   Glucose-Capillary 136 (H) 70 - 99 mg/dL   No results found.  Assessment: 78 y.o. female presenting with mild dysphasia and dystaxia secondary to acute left posterior frontal lobe infarct and subacute bilateral cerebellar infarctions, diagnosed by MRI at OSH. 1. Etiology most likely cardioembolic given multiple vascular territories involved. DDx includes paradoxical embolization via possible L-R cardiac shunt in the setting of a history of CA and DVT (especially with high likelihood of venous stasis from immobility due to severe bilateral leg swelling) as well as possible intermittent a-fib, thrombogenic valvular vegetation or cardiac mural thrombus.  2. Classifiable as having failed ASA monotherapy.  3. Stroke Risk Factors - uterine CA, HTN, HLD and DVT.     Plan: 1. HgbA1c, fasting lipid panel 2. MRA of the brain without contrast.  3. PT consult, OT consult, Speech consult 4. TTE with bubble study. May need TEE if this is negative.  5. Carotid dopplers 6.  Prophylactic therapy- Continue ASA for now. May need to add Plavix for DAPT or switch to anticoagulation pending results of the stroke work up 7. Risk factor modification 8. Telemetry monitoring 9. Frequent neuro checks 10. Ultrasound of bilateral lower extremities.   @Electronically  signed: Dr. Kerney Elbe 03/16/2019, 12:10 AM

## 2019-03-16 NOTE — Progress Notes (Signed)
  Speech Language Pathology Treatment: Cognitive-Linquistic(Aphasia )  Patient Details Name: Jillian Guerra MRN: [id number] DOB: 1940-11-04 Today's Date: 03/16/2019 Time: 6606-0045 SLP Time Calculation (min) (ACUTE ONLY): 19 min  Assessment / Plan / Recommendation Clinical Impression  Pt was seen for aphasia treatment and was cooperative during the session. She was educated regarding her goals and discharge recommendations and verbalized understanding. She provided 10-15 items per concrete category during divergent naming tasks with an average of 12 items per category but min cues were needed. She demonstrated 90% accuracy with sentence completion increasing to 100% accuracy with min. cues. She achieved 100% accuracy with sentence formulation related to single-action photos. Given two related words, she completed sentence formulation with 60% accuracy increasing to 80% with mod cues. SLP will cotninue to follow pt.    HPI HPI: Pt is a 78 y.o. 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 She had an MRI at OSH which revealed an acute left posterior frontal lobe infarct and subacute bilateral cerebellar infarctions.      SLP Plan  Continue with current plan of care  Patient needs continued Speech Lanaguage Pathology Services    Recommendations                   Follow up Recommendations: Home health SLP SLP Visit Diagnosis: Aphasia (R47.01) Plan: Continue with current plan of care       Trevante Tennell I. Hardin Negus, [place], [place] Office number [phone] Pager [place] 03/16/2019, 12:54  PM

## 2019-03-16 NOTE — Evaluation (Addendum)
Physical Therapy Evaluation Patient Details Name: Jillian Guerra MRN: [id number] DOB: 03-Sep-1941 Today's Date: 03/16/2019   History of Present Illness  Patient is a 78 y/o female who presents with speech difficulties. 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 PMH includes uterine ca, HTN,DVT.  Clinical Impression  Patient presents with language deficits, dyspnea on exertion, swelling in BLEs and impaired mobility s/p above. Pt Mod I with ADls and walking with RW PTA. Lives with daughter who can provide support at home. Today, pt tolerated transfers and gait training with supervision for safety. 2/4 DOE with HR ranging from 89-132 bpm. Pt has difficulty with language and expressing self. Pt has been getting [facility] services per report, recommend continuing these. Also noted to have pitting edema BLEs.   I have discussed the patient's current level of function related to stroke  with the patient. She acknowledges understanding of this and feel she and her daughter can provide the level of care she will need at home. Will follow acutely to maximize independence and mobility prior to return home.    Follow Up Recommendations Home health PT;Supervision - Intermittent(continue HH services)    Equipment Recommendations  None recommended by PT    Recommendations for Other Services       Precautions / Restrictions Precautions Precautions: Fall Precaution Comments: permanent catheter Restrictions Weight Bearing Restrictions: No      Mobility  Bed Mobility Overal bed mobility: Modified Independent             General bed mobility comments: HOB elevated, use of rail.  Transfers Overall transfer level: Needs assistance Equipment used: Rolling walker (2 wheeled) Transfers: Sit to/from Stand Sit to Stand: Supervision         General transfer comment: Supervision for safety. Transferred to chair post  ambulation.  Ambulation/Gait Ambulation/Gait assistance: Min guard Gait Distance (Feet): 150 Feet Assistive device: Rolling walker (2 wheeled) Gait Pattern/deviations: Step-through pattern;Decreased stride length;Wide base of support Gait velocity: decreased   General Gait Details: Slow, steady gait with wide BoS; 2/4 DOE. HR ranged from 89-132 bpm. 1 standing rest break.  Stairs            Wheelchair Mobility    Modified Rankin (Stroke Patients Only) Modified Rankin (Stroke Patients Only) Pre-Morbid Rankin Score: Moderate disability Modified Rankin: Moderately severe disability     Balance Overall balance assessment: Needs assistance Sitting-balance support: Feet supported;No upper extremity supported Sitting balance-Leahy Scale: Good Sitting balance - Comments: Total A to donn socks as pt reports she does not wear socks at home   Standing balance support: During functional activity Standing balance-Leahy Scale: Poor Standing balance comment: Requires BUE support in standing.                             Pertinent Vitals/Pain Pain Assessment: No/denies pain    Home Living Family/patient expects to be discharged to:: Private residence Living Arrangements: Children Available Help at Discharge: Family;Available 24 hours/day   Home Access: Stairs to enter Entrance Stairs-Rails: Right Entrance Stairs-Number of Steps: 2 Home Layout: Two level Home Equipment: Walker - 2 wheels;Cane - single point;Bedside commode      Prior Function Level of Independence: Independent with assistive device(s)         Comments: Does her own ADLs, drives; does not cook/clean     Hand Dominance   Dominant Hand: Right    Extremity/Trunk Assessment   Upper Extremity  Assessment Upper Extremity Assessment: Defer to OT evaluation    Lower Extremity Assessment Lower Extremity Assessment: Overall WFL for tasks assessed;LLE deficits/detail;RLE deficits/detail RLE  Deficits / Details: Pitting edema present and tenderness to palpation throughout LLE Deficits / Details: Pitting edema present and tenderness to palpation throughout       Communication   Communication: Expressive difficulties;HOH  Cognition Arousal/Alertness: Awake/alert Behavior During Therapy: WFL for tasks assessed/performed Overall Cognitive Status: Difficult to assess                                 General Comments: A&Ox4. Increased time to answer questions; difficulty with speech so hard to distinguish cognition vs language deficits. HOH as well.      General Comments General comments (skin integrity, edema, etc.): RN working on getting permanent catheter switched out.    Exercises     Assessment/Plan    PT Assessment Patient needs continued PT services  PT Problem List Decreased mobility;Decreased cognition;Decreased balance;Decreased activity tolerance       PT Treatment Interventions Therapeutic activities;Gait training;Cognitive remediation;Stair training;Balance training;Therapeutic exercise;Patient/family education;Functional mobility training;Neuromuscular re-education    PT Goals (Current goals can be found in the Care Plan section)  Acute Rehab PT Goals Patient Stated Goal: to return home PT Goal Formulation: With patient Time For Goal Achievement: 03/30/19 Potential to Achieve Goals: Good    Frequency Min 4X/week   Barriers to discharge        Co-evaluation               AM-PAC PT "6 Clicks" Mobility  Outcome Measure Help needed turning from your back to your side while in a flat bed without using bedrails?: A Little Help needed moving from lying on your back to sitting on the side of a flat bed without using bedrails?: A Little Help needed moving to and from a bed to a chair (including a wheelchair)?: None Help needed standing up from a chair using your arms (e.g., wheelchair or bedside chair)?: None Help needed to walk in  hospital room?: A Little Help needed climbing 3-5 steps with a railing? : A Little 6 Click Score: 20    End of Session Equipment Utilized During Treatment: Gait belt Activity Tolerance: Patient tolerated treatment well Patient left: in chair;with call bell/phone within reach Nurse Communication: Mobility status PT Visit Diagnosis: Difficulty in walking, not elsewhere classified (R26.2)    Time: 1696-7893 PT Time Calculation (min) (ACUTE ONLY): 27 min   Charges:   PT Evaluation $PT Eval Low Complexity: 1 Low PT Treatments $Gait Training: 8-22 mins        Wray Kearns, PT, DPT Acute Rehabilitation Services Pager [phone] Office [phone]      Marguarite Arbour A Sabra Heck 03/16/2019, 8:45 AM

## 2019-03-16 NOTE — Evaluation (Addendum)
Speech Language Pathology Evaluation Patient Details Name: Jillian Guerra MRN: [id number] DOB: 1941-03-13 Today's Date: 03/16/2019 Time: 4098-1191 SLP Time Calculation (min) (ACUTE ONLY): 16 min  Problem List:  Patient Active Problem List   Diagnosis Date Noted  . Acute CVA (cerebrovascular accident) ([place]) 03/15/2019  . Hyperlipidemia 02/14/2019  . CKD (chronic kidney disease), stage IV ([place]) 06/11/2014  . Urinary retention 06/11/2014  . DVT of leg (deep venous thrombosis) ([place]) 04/29/2011  . OTH SPEC ALVEOL&PARIETOALVEOL PNEUMONOPATHIES 11/10/2010  . ARTHRITIS 10/15/2010  . UTERINE CANCER, HX OF 10/14/2010   Past Medical History:  Past Medical History:  Diagnosis Date  . Anemia   . Arthritis   . Deep vein blood clot of left lower extremity ([place])   . GERD (gastroesophageal reflux disease)   . Hypercholesteremia   . Hypertension   . Leukocytopenia    transient  . 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 Radiation 1998   hx of  . Uterine cancer [facility])    Past Surgical History:  Past Surgical History:  Procedure Laterality Date  . APPENDECTOMY    . CHOLECYSTECTOMY    . CYSTOSCOPY/RETROGRADE/URETEROSCOPY Bilateral 05/08/2013   Procedure: CYSTOSCOPY/BLADDER BIOPSY/BILATERAL RETROGRADE PYELOGRAM;  Surgeon: Fredricka Bonine, MD;  Location: [facility];  Service: Urology;  Laterality: Bilateral;  left ureteroscpoy  . STAPEDECTOMY Bilateral [id number]  . TOTAL ABDOMINAL HYSTERECTOMY  1998   HPI:  Pt is a 78 y.o. 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 She had an MRI at OSH which revealed an acute left posterior frontal lobe infarct and subacute bilateral cerebellar  infarctions.   Assessment / Plan / Recommendation Clinical Impression  Pt reported that she was living in her daughter's in-law suite/apartment prior to admission and did not have any deficits in speech, language or cognition. She stated that her language skills have improved since admission but are still not back to baseline. Pt presents with mild expressive aphasia characterized by deficits in both verbal expression and auditory comprehension with more notable difficulty with expression. With regards to expressive language she required additional processing time for word retrieval and sentence formulation. Phonemic paraphasias were occasionally noted during attempts at word retrieval, and were inconsistently self-corrected. Receptively, she was able to follow up to 3-step commands with additional processing time and accurately respond to complex yes/no questions but she demonstrated increased difficulty with auditory comprehension at the paragraph level. Skilled SLP services are clinically indicated at this time to improve communication.     SLP Assessment  SLP Recommendation/Assessment: Patient needs continued Speech Lanaguage Pathology Services SLP Visit Diagnosis: Aphasia (R47.01)    Follow Up Recommendations   Home Health SLP   Frequency and Duration min 2x/week  2 weeks      SLP Evaluation Cognition  Overall Cognitive Status: Difficult to assess(Due to aphasia )       Comprehension  Auditory Comprehension Overall Auditory Comprehension: Impaired Yes/No Questions: Impaired Basic Biographical Questions: (5/5) Complex Questions: (5/5) Paragraph Comprehension (via yes/no questions): (2/4) Commands: Impaired Two Step Basic Commands: (3/3) Multistep Basic Commands: (3/4) Visual Recognition/Discrimination Discrimination: Within Function Limits Reading Comprehension Reading Status: Within funtional limits    Expression Expression Primary Mode of Expression: Verbal Verbal  Expression Overall Verbal Expression: Impaired Initiation:  No impairment Automatic Speech: Counting;Day of week;Month of year(WNL) Level of Generative/Spontaneous Verbalization: Conversation Repetition: No impairment(5/5) Naming: Impairment Responsive: (4/5 with additional processing time) Confrontation: Within functional limits(10/10) Convergent: (Sentence completion: 5/5)   Oral / Motor  Oral Motor/Sensory Function Overall Oral Motor/Sensory Function: Within functional limits Motor Speech Overall Motor Speech: Appears within functional limits for tasks assessed Respiration: Within functional limits Phonation: Normal Resonance: Within functional limits Articulation: Within functional limitis Intelligibility: Intelligible Motor Planning: Witnin functional limits Motor Speech Errors: Not applicable   Ygnacio Fecteau I. Hardin Negus, [place], [place] Office number [phone] Pager [place] 03/16/2019, 12:44 PM

## 2019-03-16 NOTE — Progress Notes (Signed)
STROKE TEAM PROGRESS NOTE   INTERVAL HISTORY I have reviewed history of presenting illness in details with the patient as well as I spoke to her daughter over the phone who is a Designer, jewellery.  Patient developed speech and word finding difficulties for couple of days.  She had MRI at [facility] on 03/15/2019 which I personally reviewed shows left frontal as well as bilateral cerebellar infarcts likely of embolic etiology.  No neurovascular imaging was done.  Patient denies any prior history of TIA strokes, palpitations, atrial fibrillation, syncope or previous cardiac problems.  She continues to to have some speech difficulties but no other focal deficits  Vitals:   03/16/19 0332 03/16/19 0828 03/16/19 0830 03/16/19 1203  BP: (!) 101/44 (!) 104/43 (!) 90/51 (!) 92/42  Pulse: 69 60 63 (!) 59  Resp: 14 (!) 24  20  Temp: 98 F (36.7 C) (!) 97.5 F (36.4 C)  97.7 F (36.5 C)  TempSrc: Oral Oral  Oral  SpO2: 99% 100%  100%  Weight:      Height:        CBC:  Recent Labs  Lab 03/15/19 2127 03/15/19 2140  WBC 6.6  --   NEUTROABS 4.2  --   HGB 8.8* 9.2*  HCT 28.9* 27.0*  MCV 105.9*  --   PLT 231  --     Basic Metabolic Panel:  Recent Labs  Lab 03/15/19 2127 03/15/19 2140  NA 140 138  K 4.0 4.2  CL 104 103  CO2 25  --   GLUCOSE 152* 147*  BUN 42* 50*  CREATININE 2.35* 2.30*  CALCIUM 9.1  --    Lipid Panel:     Component Value Date/Time   CHOL 114 03/16/2019 0528   TRIG 61 03/16/2019 0528   HDL 57 03/16/2019 0528   CHOLHDL 2.0 03/16/2019 0528   VLDL 12 03/16/2019 0528   LDLCALC 45 03/16/2019 0528   HgbA1c:  Lab Results  Component Value Date   HGBA1C 5.3 03/16/2019   Urine Drug Screen: No results found for: LABOPIA, COCAINSCRNUR, LABBENZ, AMPHETMU, THCU, LABBARB  Alcohol Level No results found for: ETH  IMAGING Vas US Carotid (at [place] Only)  Result Date: 03/16/2019 Carotid Arterial Duplex Study Indications:       CVA. Risk Factors:       Hypertension. Comparison Study:  no prior study Performing Technologist: Abram Sander RVS  Examination Guidelines: A complete evaluation includes B-mode imaging, spectral Doppler, color Doppler, and power Doppler as needed of all accessible portions of each vessel. Bilateral testing is considered an integral part of a complete examination. Limited examinations for reoccurring indications may be performed as noted.  Right Carotid Findings: +----------+--------+--------+--------+-----------+--------+           PSV cm/sEDV cm/sStenosisDescribe   Comments +----------+--------+--------+--------+-----------+--------+ CCA Prox  60      10              homogeneous         +----------+--------+--------+--------+-----------+--------+ CCA Distal73      17              homogeneous         +----------+--------+--------+--------+-----------+--------+ ICA Prox  63      11      1-39%   homogeneous         +----------+--------+--------+--------+-----------+--------+ ICA Distal75      25                                  +----------+--------+--------+--------+-----------+--------+  ECA       103                                         +----------+--------+--------+--------+-----------+--------+ +----------+--------+-------+--------+-------------------+           PSV cm/sEDV cmsDescribeArm Pressure (mmHG) +----------+--------+-------+--------+-------------------+ [id number]                                        +----------+--------+-------+--------+-------------------+ +---------+--------+--+--------+--+---------+ VertebralPSV cm/s52EDV cm/s11Antegrade +---------+--------+--+--------+--+---------+  Left Carotid Findings: +----------+--------+--------+--------+-----------+---------------------------+           PSV cm/sEDV cm/sStenosisDescribe   Comments                    +----------+--------+--------+--------+-----------+---------------------------+ CCA Prox  93       23              homogeneous                            +----------+--------+--------+--------+-----------+---------------------------+ CCA Distal106     23              homogeneous                            +----------+--------+--------+--------+-----------+---------------------------+ ICA Prox                  1-39%   homogeneousvelocity image not captured +----------+--------+--------+--------+-----------+---------------------------+ ICA Distal75      21                                                     +----------+--------+--------+--------+-----------+---------------------------+ ECA       75                                                             +----------+--------+--------+--------+-----------+---------------------------+ +----------+--------+--------+--------+-------------------+ SubclavianPSV cm/sEDV cm/sDescribeArm Pressure (mmHG) +----------+--------+--------+--------+-------------------+           82                                          +----------+--------+--------+--------+-------------------+ +---------+--------+--+--------+--+ VertebralPSV cm/s55EDV cm/s10 +---------+--------+--+--------+--+  Summary: Right Carotid: Velocities in the right ICA are consistent with a 1-39% stenosis. Left Carotid: Velocities in the left ICA are consistent with a 1-39% stenosis. Vertebrals: Bilateral vertebral arteries demonstrate antegrade flow. *See table(s) above for measurements and observations.  Electronically signed by Antony Contras MD on 03/16/2019 at 10:45:34 AM.    Final    Vas [place] Transcranial Doppler  Result Date: 03/16/2019  Transcranial Doppler Indications: Stroke. Limitations for diagnostic windows: Unable to insonate right transtemporal window. Unable to insonate left transtemporal window. Performing Technologist: Abram Sander RVS  Examination Guidelines: A complete evaluation includes B-mode imaging, spectral Doppler, color Doppler, and  power Doppler as needed of all accessible portions of each vessel. Bilateral testing  is considered an integral part of a complete examination. Limited examinations for reoccurring indications may be performed as noted.  +----------+-------------+----------+-----------+-------+ RIGHT TCD Right VM (cm)Depth (cm)PulsatilityComment +----------+-------------+----------+-----------+-------+ Opthalmic     24.00                 1.71            +----------+-------------+----------+-----------+-------+ ICA siphon    43.00                 1.47            +----------+-------------+----------+-----------+-------+  +----------+------------+----------+-----------+-------+ LEFT TCD  Left VM (cm)Depth (cm)PulsatilityComment +----------+------------+----------+-----------+-------+ Opthalmic    23.00                 1.79            +----------+------------+----------+-----------+-------+ ICA siphon   44.00                                 +----------+------------+----------+-----------+-------+  Summary:  Absent bitemporal and occipital windows greatly limit exam.Normal blood flow directionsand velocities in both opthalmics and carotid siphons *See table(s) above for measurements and observations.  Diagnosing physician: Antony Contras MD Electronically signed by Antony Contras MD on 03/16/2019 at 10:47:25 AM.    Final     PHYSICAL EXAM Pleasant elderly Caucasian lady who is hard of hearing.  Not in distress. . Afebrile. Head is nontraumatic. Neck is supple without bruit.    Cardiac exam no murmur or gallop. Lungs are clear to auscultation. Distal pulses are well felt. Neurological Exam ;  Awake  Alert oriented x 3.  Slightly nonfluent hesitant speech and delay in her responses.  Mild difficulty with naming and repetition.  Able to name only 9 animals which walk on 4 legs.  Poor recall 0/3.  Eye movements full without nystagmus but mild saccadic dysmetria on right gaze.fundi were not visualized.  Vision acuity and fields appear normal. Hearing is normal. Palatal movements are normal. Face symmetric. Tongue midline. Normal strength, tone, reflexes and coordination. Normal sensation. Gait deferred.  ASSESSMENT/PLAN Ms. Jillian Guerra is a 78 y.o. female with history of uterine cancer, HTN, HLD, DVT presenting with difficulty with word finding for a few days.   Stroke:  L posterior and B cerebellar infarcts felt to be embolic secondary to unknown etiology  MRI  (done at outside hospital) acute L posterior frontal lobe infarct, subacute B cerebellar infarcts  Carotid Doppler  B ICA 1-39% stenosis, VAs antegrade   2D Echo pending  TCD limited d/t absent bitemporal and occipital windows.    LDL 45  HgbA1c 5.3  Heparin 5000 units sq tid for VTE prophylaxis  aspirin 81 mg daily prior to admission, now on aspirin 81 mg daily. Given mild stroke, recommend aspirin 81 mg and plavix 75 mg daily x 3 weeks, then PLAVIX alone. Orders adjusted.   Therapy recommendations:  HH PT,    Disposition:  Return home  Hypertension  Stable - on the low side . Permissive hypertension (OK if < 220/120) but gradually normalize in 5-7 days . 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   She presented with 2 to 3-day history of speech and word finding difficulties due to embolic left frontal and bilateral cerebral infarcts of unknown determine etiology.  Recommend check echo results and if negative consider doing transesophageal echocardiogram and prolonged cardiac monitoring with loop recorder.  Aspirin and Plavix for 3 weeks followed by Plavix alone.  Long discussion with the patient as well as with the daughter over the phone and answered questions.  Discussed with Dr. Kathline Magic Greater than 50% time during this 35-minute visit were spent on counseling and coordination of care about her embolic stroke and discussion about evaluation and treatment plan and answering questions.   Antony Contras, MD Medical Director [facility] Pager: [phone] 03/16/2019 12:59 PM   To contact Stroke Continuity provider, please refer to [email or web address] After hours, contact General Neurology

## 2019-03-16 NOTE — Progress Notes (Signed)
TCD and carotid duplex has been completed.   Preliminary results in CV Proc.   Abram Sander 03/16/2019 9:38 AM

## 2019-03-16 NOTE — Plan of Care (Signed)
Pt alert and oriented. Participates with Speech and Physical therapy during shift. Verbalizes understanding of stroke signs and symptoms and when to seek help.

## 2019-03-16 NOTE — Evaluation (Signed)
Occupational Therapy Evaluation Patient Details Name: Jillian Guerra MRN: [id number] DOB: 1941-05-19 Today's Date: 03/16/2019    History of Present Illness Patient is a 78 y/o female who presents with speech difficulties. 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 PMH includes uterine ca, HTN,DVT.   Clinical Impression   Pt PTA: living with family was independent with ADL and mobility. Pt limited by BLE swelling and speech difficulties. Pt appears to comprehend [facility]), but requires cues to attend to task and has difficulty expressing language beyond "yes" or "no." Pt set-upA for UB ADL and minguardA for LB ADL. Pt stood at sink for grooming x3 mins with minguardA for support and rto reduce LOB.  Pt currently performing ADL functional mobility in room with minguardA and use of RW. Pt requiring 1 step commands for proper technique with RW. Pt donned socks in sitting with increased time. Pt requiring assist for ADL, safety and mobility in [place] setting. OT to follow acutely.       Follow Up Recommendations  Home health OT;Supervision/Assistance - 24 hour    Equipment Recommendations  None recommended by OT    Recommendations for Other Services       Precautions / Restrictions Precautions Precautions: Fall Restrictions Weight Bearing Restrictions: No      Mobility Bed Mobility Overal bed mobility: Modified Independent             General bed mobility comments: HOB elevated, use of rail.  Transfers Overall transfer level: Needs assistance Equipment used: Rolling walker (2 wheeled) Transfers: Sit to/from Stand Sit to Stand: Supervision         General transfer comment: Supervision for safety. Transferred to chair post ambulation.    Balance Overall balance assessment: Needs assistance Sitting-balance support: Feet supported;No upper extremity supported Sitting balance-Leahy Scale: Good Sitting balance - Comments:  donned socks sitting EOB with set-upA   Standing balance support: During functional activity Standing balance-Leahy Scale: Poor Standing balance comment: Requires BUE support in standing.                           ADL either performed or assessed with clinical judgement   ADL Overall ADL's : Needs assistance/impaired Eating/Feeding: Set up;Sitting   Grooming: Set up;Standing   Upper Body Bathing: Set up;Sitting   Lower Body Bathing: Min guard;Sitting/lateral leans;Sit to/from stand   Upper Body Dressing : Set up;Sitting   Lower Body Dressing: Min guard;Sitting/lateral leans;Sit to/from stand   Toilet Transfer: Min guard;BSC   Toileting- Water quality scientist and Hygiene: Min guard;Sitting/lateral lean;Sit to/from stand       Functional mobility during ADLs: Radio broadcast assistant ADL Comments: Pt performing ADL with increased time and effort     Vision Baseline Vision/History: No visual deficits Patient Visual Report: No change from baseline Vision Assessment?: No apparent visual deficits     Perception     Praxis      Pertinent Vitals/Pain Pain Assessment: No/denies pain     Hand Dominance Right   Extremity/Trunk Assessment Upper Extremity Assessment Upper Extremity Assessment: Generalized weakness   Lower Extremity Assessment Lower Extremity Assessment: Defer to PT evaluation   Cervical / Trunk Assessment Cervical / Trunk Assessment: Normal   Communication Communication Communication: Expressive difficulties;HOH   Cognition Arousal/Alertness: Awake/alert Behavior During Therapy: WFL for tasks assessed/performed Overall Cognitive Status: Difficult to assess(aphasia)  General Comments: AOx4. Increased time and effort for answering questions   General Comments       Exercises     Shoulder Instructions      Home Living Family/patient expects to be discharged to:: Private  residence Living Arrangements: Children Available Help at Discharge: Family;Available 24 hours/day Type of Home: House Home Access: Stairs to enter [employer] of Steps: 2 Entrance Stairs-Rails: Right Home Layout: Two level Alternate Level Stairs-Number of Steps: stair lift   Bathroom Shower/Tub: Occupational psychologist: Standard     Home Equipment: Environmental consultant - 2 wheels;Cane - single point;Bedside commode      Lives With: Family    Prior Functioning/Environment Level of Independence: Independent with assistive device(s)        Comments: Does her own ADLs, drives; does not cook/clean        OT Problem List: Decreased activity tolerance;Impaired balance (sitting and/or standing);Decreased safety awareness;Decreased coordination      OT Treatment/Interventions: Self-care/ADL training;Therapeutic exercise;Neuromuscular education;Energy conservation;Therapeutic activities;Balance training;Patient/family education    OT Goals(Current goals can be found in the care plan section) Acute Rehab OT Goals Patient Stated Goal: to return home OT Goal Formulation: With patient Time For Goal Achievement: 03/30/19 Potential to Achieve Goals: Good ADL Goals Pt Will Perform Grooming: with modified independence;standing Pt Will Perform Lower Body Dressing: with modified independence;sit to/from stand Pt Will Perform Toileting - Clothing Manipulation and hygiene: with modified independence;sit to/from stand Additional ADL Goal #1: Pt will be supervision level for OOB ADL with fair dynamic standing balance  OT Frequency: Min 2X/week   Barriers to D/C:            Co-evaluation              AM-PAC OT "6 Clicks" Daily Activity     Outcome Measure Help from another person eating meals?: None Help from another person taking care of personal grooming?: A Little Help from another person toileting, which includes using toliet, bedpan, or urinal?: A Little Help from  another person bathing (including washing, rinsing, drying)?: A Little Help from another person to put on and taking off regular upper body clothing?: A Little Help from another person to put on and taking off regular lower body clothing?: A Little 6 Click Score: 19   End of Session Equipment Utilized During Treatment: Gait belt;Rolling walker Nurse Communication: Mobility status  Activity Tolerance: Patient tolerated treatment well Patient left: in chair;with call bell/phone within reach;with chair alarm set  OT Visit Diagnosis: Unsteadiness on feet (R26.81);Muscle weakness (generalized) (M62.81)                Time: 9242-6834 OT Time Calculation (min): 17 min Charges:  OT General Charges $OT Visit: 1 Visit OT Evaluation $OT Eval Moderate Complexity: 1 Mod  Darryl Nestle) Marsa Aris OTR/L Acute Rehabilitation Services Pager: [phone] Office: [phone]   Audie Pinto 03/16/2019, 3:33 PM

## 2019-03-17 ENCOUNTER — Inpatient Hospital Stay (HOSPITAL_COMMUNITY): Payer: Medicare Other

## 2019-03-17 DIAGNOSIS — I639 Cerebral infarction, unspecified: Secondary | ICD-10-CM

## 2019-03-17 DIAGNOSIS — I6340 Cerebral infarction due to embolism of unspecified cerebral artery: Principal | ICD-10-CM

## 2019-03-17 NOTE — Progress Notes (Signed)
PROGRESS NOTE     Jillian Guerra OAC:[id number] DOB: 01/15/1941 DOA: 03/15/2019 PCP: Curly Rim, MD   LOS: 2 days   Brief narrative: KANIJA REMMEL is a 78 y.o. 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 Patient developed word finding difficulty intermittently for 1 day and was seen by her PCP on 03/15/2019 for further evaluation.   An MRI brain was obtained which revealed an acute infarct of the left posterior frontal lobe with acute/subacute infarcts in the cerebellar hemispheres bilaterally (radiology report available in care everywhere).    Subjective:  The patient was seen and examined this morning, stable no acute distress denies of any headaches visual changes.  No progression of her symptoms.  Reporting improved in her speech fluency.  But have difficulty with memory now.  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  Seen by neurology in the hospital.   -Duplex shows less than 39% stenosis bilaterally.   -Transcranial Doppler was limited.  - Hemoglobin A1c of 5.3.  On aspirin 81 mg.   -Neurology recommended adding Plavix at 5 mg daily for 3 weeks then Plavix alone. -  PT has recommended home health PT.  -Pending 2D  echocardiogram -reviewed essentially within normal limits, no source of emboli  Will need to rule out embolic CVA. --Likely need TEE  - Patient has been seen by speech therapy and continued speech therapy on discharge.  CKD stage IV peripheral edema: -Currently stable Secondary to obstructive uropathy and suspected FSGS.  Follows with nephrology, Dr. Rogers Blocker, in [place].  Will follow creatinine levels.  Patient will need to follow-up with nephrology as outpatient.  Urinary retention with chronic indwelling Foley: -Stable no changes Chronic urinary retention reportedly current after pelvic radiation for prior endometrial cancer.  Foley catheter in place last exchange 03/15/2019 per patient.  Continue Foley care.  Anemia of chronic disease: -Monitoring H&H, currently stable,  Hyperlipidemia: We will change simvastatin to atorvastatin on discharge.  VTE Prophylaxis: Heparin Code Status: DNR  Family Communication: No family member present at bedside,  previous hospitalist is contacted patient's family informed of current CVA work-up, pending PT/OT, Neurology following  Disposition Plan: Pending work-up, physical therapy recommend home health PT. Pending further recommendation from neurology, pending recommending TEE Likely will be discharged home with home health   Noted a 2D echocardiogram without mention of any thrombus.  Messaged cardiology master requesting TEE.  Consultants:  Neurology  Procedures:  None  Antibiotics: Anti-infectives (From admission, onward)   None      Objective: Vitals:   03/17/19 0742 03/17/19 1135  BP: (!) 100/51 (!) 90/50  Pulse: 67 61  Resp: 16 16  Temp: 98.9 F (37.2 C) 98.7 F (37.1 C)  SpO2: 98% 100%    Intake/Output Summary (Last 24 hours) at 03/17/2019 1314 Last data filed at 03/17/2019 0300 Gross  per 24 hour  Intake -  Output 450 ml  Net -450 ml   Filed Weights   03/15/19 2123  Weight: 90.7 kg   Body  mass index is 35.43 kg/m.   Physical Exam: BP (!) 90/50 (BP Location: Left Arm)   Pulse 61   Temp 98.7 F (37.1 C) (Oral)   Resp 16   Ht 5\' 3"  (1.6 m)   Wt 90.7 kg   SpO2 100%   BMI 35.43 kg/m    Physical Exam  Constitution:  Alert, cooperative, no distress, asymmetric facial droop, speech intact Psychiatric: Normal and stable mood and affect, cognition intact,   HEENT: Normocephalic, PERRL, otherwise with in Normal limits  Chest:Chest symmetric Cardio vascular:  S1/S2, RRR, No murmure, No Rubs or Gallops  pulmonary: Clear to auscultation bilaterally, respirations unlabored, negative wheezes / crackles Abdomen: Soft, non-tender, non-distended, bowel sounds,no masses, no organomegaly Muscular skeletal:   Limited exam - in bed, able to move all 4 extremities, Normal strength,  Neuro: Asymmetric facial droop, speech intact, cognition intact,CNII-XII intact. , normal motor and sensation, reflexes intact  Extremities: No pitting edema lower extremities, +2 pulses  Skin: Dry, warm to touch, negative for any Rashes, No open wounds Wounds: per nursing documentation     Data Review: I have personally reviewed the following laboratory data and studies,  CBC: Recent Labs  Lab 03/15/19 2127 03/15/19 2140  WBC 6.6  --   NEUTROABS 4.2  --   HGB 8.8* 9.2*  HCT 28.9* 27.0*  MCV 105.9*  --   PLT 231  --    Basic Metabolic Panel: Recent Labs  Lab 03/15/19 2127 03/15/19 2140  NA 140 138  K 4.0 4.2  CL 104 103  CO2 25  --   GLUCOSE 152* 147*  BUN 42* 50*  CREATININE 2.35* 2.30*  CALCIUM 9.1  --    Liver Function Tests: Recent Labs  Lab 03/15/19 2127  AST 19  ALT 14  ALKPHOS 86  BILITOT 0.4  PROT 6.0*  ALBUMIN 2.5*   CBG: Recent Labs  Lab 03/15/19 2158  GLUCAP 136*   Recent Results (from the past 240 hour(s))  SARS Coronavirus 2     Status: None   Collection Time: 03/15/19 10:04 PM  Result Value Ref Range Status   SARS Coronavirus 2 NOT DETECTED NOT  DETECTED Final    Comment: (NOTE) SARS-CoV-2 target nucleic acids are NOT DETECTED. The SARS-CoV-2 RNA is generally detectable in upper and lower respiratory specimens during the acute phase of infection.  Negative  results do not preclude SARS-CoV-2 infection, do not rule out co-infections with other pathogens, and should not be used as the sole basis for treatment or other patient management decisions.  Negative results must be combined with clinical observations, patient history, and epidemiological information. The expected result is Not Detected. Fact Sheet for Patients: http://www.biofiredefense.com/wp-content/uploads/2020/03/BIOFIRE-COVID -19-patients.pdf Fact Sheet for Healthcare Providers: http://www.biofiredefense.com/wp-content/uploads/2020/03/BIOFIRE-COVID -19-hcp.pdf This test is not yet approved or cleared by the [place] and  has been authorized for detection and/or diagnosis of SARS-CoV-2 by FDA under an Emergency Use Authorization (EUA).  This EUA will remain in effec t (meaning this test can be used) for the duration of  the COVID-19 declaration under Section 564(b)(1) of the Act, 21 U.S.C. section 360bbb-3(b)(1), unless the authorization is terminated or revoked sooner. Performed at [facility], [place] [address], [place], [place] [address]      Studies: Vas US Carotid (at [place] Only)  Result Date: 03/16/2019 Carotid Arterial Duplex Study Indications:       CVA. Risk Factors:   Summary: Right Carotid: Velocities in the right ICA are consistent with a 1-39% stenosis. Left Carotid: Velocities in the left ICA are consistent with a 1-39% stenosis. Vertebrals: Bilateral vertebral arteries demonstrate antegrade flow. *See table(s) above for measurements and observations.  Electronically signed by Antony Contras MD on 03/16/2019 at 10:45:34 AM.    Final    Vas [place] Transcranial Doppler  Result Date: 03/16/2019  Summary:  Absent bitemporal and occipital  windows greatly limit exam.Normal blood flow directionsand velocities in both opthalmics and carotid siphons *See table(s) above for measurements and observations.  Diagnosing physician: Antony Contras MD Electronically signed by Antony Contras MD on 03/16/2019 at 10:47:25 AM.    Final     Scheduled Meds: . aspirin EC  81 mg Oral Daily  . atorvastatin  40 mg Oral q1800  . clopidogrel  75 mg Oral Daily  . heparin  5,000 Units Subcutaneous Q8H     SIGNED: Deatra James, MD, FACP, FHM. [facility],  Pager [phone]  If 7PM-7AM, please contact night-coverage [email or web address] [facility] 03/17/2019, 1:23 PM

## 2019-03-17 NOTE — Progress Notes (Signed)
STROKE TEAM PROGRESS NOTE   INTERVAL HISTORY Pt lying in bed, no complains. Pending MRA. LE venous Doppler no DVT.  Pending loop recorder on Monday  Vitals:   03/16/19 1958 03/17/19 0012 03/17/19 0339 03/17/19 0742  BP: (!) 114/52 (!) 97/37 (!) 95/45 (!) 100/51  Pulse: 69 62 64 67  Resp: 15 14 14 16   Temp: 98 F (36.7 C) 98 F (36.7 C) 98 F (36.7 C) 98.9 F (37.2 C)  TempSrc: Oral Oral Oral Oral  SpO2: 100% 98% 98% 98%  Weight:      Height:        CBC:  Recent Labs  Lab 03/15/19 2127 03/15/19 2140  WBC 6.6  --   NEUTROABS 4.2  --   HGB 8.8* 9.2*  HCT 28.9* 27.0*  MCV 105.9*  --   PLT 231  --     Basic Metabolic Panel:  Recent Labs  Lab 03/15/19 2127 03/15/19 2140  NA 140 138  K 4.0 4.2  CL 104 103  CO2 25  --   GLUCOSE 152* 147*  BUN 42* 50*  CREATININE 2.35* 2.30*  CALCIUM 9.1  --    Lipid Panel:     Component Value Date/Time   CHOL 114 03/16/2019 0528   TRIG 61 03/16/2019 0528   HDL 57 03/16/2019 0528   CHOLHDL 2.0 03/16/2019 0528   VLDL 12 03/16/2019 0528   LDLCALC 45 03/16/2019 0528   HgbA1c:  Lab Results  Component Value Date   HGBA1C 5.3 03/16/2019   Urine Drug Screen: No results found for: LABOPIA, COCAINSCRNUR, LABBENZ, AMPHETMU, THCU, LABBARB  Alcohol Level No results found for: ETH  IMAGING Vas US Carotid (at [place] Only)  Result Date: 03/16/2019 Carotid Arterial Duplex Study Indications:       CVA. Risk Factors:      Hypertension. Comparison Study:  no prior study Performing Technologist: Abram Sander RVS  Examination Guidelines: A complete evaluation includes B-mode imaging, spectral Doppler, color Doppler, and power Doppler as needed of all accessible portions of each vessel. Bilateral testing is considered an integral part of a complete examination. Limited examinations for reoccurring indications may be performed as noted.  Right Carotid Findings: +----------+--------+--------+--------+-----------+--------+           PSV  cm/sEDV cm/sStenosisDescribe   Comments +----------+--------+--------+--------+-----------+--------+ CCA Prox  60      10              homogeneous         +----------+--------+--------+--------+-----------+--------+ CCA Distal73      17              homogeneous         +----------+--------+--------+--------+-----------+--------+ ICA Prox  63      11      1-39%   homogeneous         +----------+--------+--------+--------+-----------+--------+ ICA Distal75      25                                  +----------+--------+--------+--------+-----------+--------+ ECA       103                                         +----------+--------+--------+--------+-----------+--------+ +----------+--------+-------+--------+-------------------+           PSV cm/sEDV cmsDescribeArm Pressure (mmHG) +----------+--------+-------+--------+-------------------+ [id number]                                        +----------+--------+-------+--------+-------------------+ +---------+--------+--+--------+--+---------+  VertebralPSV cm/s52EDV cm/s11Antegrade +---------+--------+--+--------+--+---------+  Left Carotid Findings: +----------+--------+--------+--------+-----------+---------------------------+           PSV cm/sEDV cm/sStenosisDescribe   Comments                    +----------+--------+--------+--------+-----------+---------------------------+ CCA Prox  93      23              homogeneous                            +----------+--------+--------+--------+-----------+---------------------------+ CCA Distal106     23              homogeneous                            +----------+--------+--------+--------+-----------+---------------------------+ ICA Prox                  1-39%   homogeneousvelocity image not captured +----------+--------+--------+--------+-----------+---------------------------+ ICA Distal75      21                                                      +----------+--------+--------+--------+-----------+---------------------------+ ECA       75                                                             +----------+--------+--------+--------+-----------+---------------------------+ +----------+--------+--------+--------+-------------------+ SubclavianPSV cm/sEDV cm/sDescribeArm Pressure (mmHG) +----------+--------+--------+--------+-------------------+           82                                          +----------+--------+--------+--------+-------------------+ +---------+--------+--+--------+--+ VertebralPSV cm/s55EDV cm/s10 +---------+--------+--+--------+--+  Summary: Right Carotid: Velocities in the right ICA are consistent with a 1-39% stenosis. Left Carotid: Velocities in the left ICA are consistent with a 1-39% stenosis. Vertebrals: Bilateral vertebral arteries demonstrate antegrade flow. *See table(s) above for measurements and observations.  Electronically signed by Antony Contras MD on 03/16/2019 at 10:45:34 AM.    Final    Vas [place] Transcranial Doppler  Result Date: 03/16/2019  Transcranial Doppler Indications: Stroke. Limitations for diagnostic windows: Unable to insonate right transtemporal window. Unable to insonate left transtemporal window. Performing Technologist: Abram Sander RVS  Examination Guidelines: A complete evaluation includes B-mode imaging, spectral Doppler, color Doppler, and power Doppler as needed of all accessible portions of each vessel. Bilateral testing is considered an integral part of a complete examination. Limited examinations for reoccurring indications may be performed as noted.  +----------+-------------+----------+-----------+-------+ RIGHT TCD Right VM (cm)Depth (cm)PulsatilityComment +----------+-------------+----------+-----------+-------+ Opthalmic     24.00                 1.71            +----------+-------------+----------+-----------+-------+ ICA  siphon    43.00                 1.47            +----------+-------------+----------+-----------+-------+  +----------+------------+----------+-----------+-------+ LEFT TCD  Left VM (cm)Depth (cm)PulsatilityComment +----------+------------+----------+-----------+-------+  Opthalmic    23.00                 1.79            +----------+------------+----------+-----------+-------+ ICA siphon   44.00                                 +----------+------------+----------+-----------+-------+  Summary:  Absent bitemporal and occipital windows greatly limit exam.Normal blood flow directionsand velocities in both opthalmics and carotid siphons *See table(s) above for measurements and observations.  Diagnosing physician: Antony Contras MD Electronically signed by Antony Contras MD on 03/16/2019 at 10:47:25 AM.    Final     PHYSICAL EXAM Pleasant elderly Caucasian lady who is hard of hearing.  Not in distress. . Afebrile. Head is nontraumatic. Neck is supple without bruit.    Cardiac exam no murmur or gallop. Lungs are clear to auscultation. Distal pulses are well felt. Neurological Exam ;  Awake  Alert oriented x 3.  Slightly nonfluent hesitant speech and delay in her responses.  Mild difficulty with naming and repetition.  Able to name only 9 animals which walk on 4 legs.  Poor recall 0/3.  Eye movements full without nystagmus but mild saccadic dysmetria on right gaze.fundi were not visualized. Vision acuity and fields appear normal. Hearing is normal. Palatal movements are normal. Face symmetric. Tongue midline. Normal strength, tone, reflexes and coordination. Normal sensation. Gait deferred.  ASSESSMENT/PLAN Ms. Jillian Guerra is a 78 y.o. female with history of uterine cancer, HTN, HLD, DVT presenting with difficulty with word finding for a few days.   Stroke:  L posterior and B cerebellar infarcts felt to be embolic secondary to unknown etiology  MRI  (done at outside hospital) acute L  posterior frontal lobe infarct, subacute B cerebellar infarcts  Carotid Doppler unremarkable  MRA head pending  2D Echo EF 45 to 50%  TCD absent bitemporal and occipital windows.    Recommend loop recorder to rule out A. fib  LDL 45  HgbA1c 5.3  Heparin 5000 units sq tid for VTE prophylaxis  aspirin 81 mg daily prior to admission, now on aspirin 81 mg and plavix 75 mg daily x 3 weeks, then PLAVIX alone.  Therapy recommendations:  HH PT   Disposition:  Return home  Hypertension  Stable - on the low side . 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 After hours, contact General Neurology

## 2019-03-18 MED ORDER — SODIUM CHLORIDE 0.9 % IV BOLUS
500.0000 mL | Freq: Once | INTRAVENOUS | Status: AC
  Administered 2019-03-18: 500 mL via INTRAVENOUS

## 2019-03-18 NOTE — Progress Notes (Signed)
PROGRESS NOTE     Jillian Guerra KPT:[id number] DOB: [birth year] DOA: 03/15/2019 PCP: Curly Rim, MD   LOS: 3 days   Brief narrative: Jillian Guerra is a 78 y.o. 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 Patient developed word finding difficulty intermittently for 1 day and was seen by her PCP on 03/15/2019 for further evaluation.   An MRI brain was obtained which revealed an acute infarct of the left posterior frontal lobe with acute/subacute infarcts in the cerebellar hemispheres bilaterally (radiology report available in care everywhere).    Subjective:  The patient was seen and examined this morning, stable, no progression of neurological symptoms.  Hard of hearing No issues overnight   Assessment/Plan:  Principal Problem:   Acute CVA (cerebrovascular accident) ([place]) Active Problems:   CKD (chronic kidney disease), stage IV ([facility])   Hyperlipidemia   Urinary retention  Acute posterior frontal lobe infarct/subacute bilateral cerebellar infarcts: -Remained stable, no progression of neurological symptoms,  - MRI at the outside hospital available in care everywhere.  Seen by neurology in the hospital.   - MRA: IMPRESSION: 1. Acute nonhemorrhagic infarct in the anterior left frontal lobe. 2. Punctate cortical acute nonhemorrhagic infarct in the right parietal lobe. 3. Linear acute nonhemorrhagic infarcts involve the cerebellum bilaterally. 4. Infarcts of multiple territories suggest a central embolic source. 5. Diffuse white matter disease suggests chronic microvascular ischemia. 6. MRA  circle-of-Willis demonstrates no significant proximal stenosis or occlusion. Distal disease is not evident on this study.   -Duplex shows less than 39% stenosis bilaterally.   -Transcranial Doppler was limited.  - Hemoglobin A1c of 5.3.  On aspirin 81 mg.   -Neurology recommended adding Plavix at 5 mg daily for 3 weeks, then Plavix alone. -  PT has recommended home health PT.  -2D echocardiogram -reviewed essentially within normal limits, no source of emboli  Will need to rule out embolic CVA. -- -  TEE -Patient needs a loop recorder -according to neurology will be placed tomorrow  - Patient has been seen by speech therapy and continued speech therapy on discharge.  CKD stage IV peripheral edema: -Currently stable Secondary to obstructive uropathy and suspected FSGS.  Follows with nephrology, Dr. Rogers Blocker, in [place].  Will follow creatinine levels.  Patient will need to follow-up with nephrology as outpatient.  Urinary retention with chronic indwelling Foley: -Stable no changes Chronic urinary retention reportedly current after pelvic radiation for prior endometrial cancer.  Foley catheter in place last exchange 03/15/2019 per patient.  Continue Foley care.  Anemia of chronic disease: -Monitoring H&H, currently stable,  Hyperlipidemia: We will change simvastatin to atorvastatin on discharge.  Hypotensive -Monitoring closely,  -will initiate 500 mL bolus normal saline   VTE Prophylaxis: Heparin Code Status: DNR  Family Communication: No family member present at bedside,  previous hospitalist is contacted patient's family informed of current CVA work-up, pending PT/OT, Neurology following  Disposition Plan: Pending work-up, physical therapy recommend home health PT. Pending further recommendation from neurology,  Likely will be discharged home with home health -Pending placement of loop recorder in a.m., TEE, prior to discharge        Consultants:  Neurology  Procedures/ Imaging: MRA IMPRESSION: 1. Acute nonhemorrhagic infarct in the anterior left frontal lobe. 2. Punctate cortical acute nonhemorrhagic infarct in the right parietal lobe. 3. Linear acute nonhemorrhagic infarcts involve the cerebellum bilaterally. 4. Infarcts of multiple territories suggest a central embolic source. 5. Diffuse white matter disease suggests chronic microvascular ischemia. 6. MRA circle-of-Willis demonstrates no significant proximal stenosis or occlusion. Distal disease is not evident on this study.    Antibiotics: Anti-infectives (From admission, onward)   None      Objective: Vitals:   03/18/19 0810 03/18/19 1138  BP: 120/64 (!) 86/44  Pulse: 64 62  Resp: 16 16  Temp: 98.2 F (36.8 C) 99 F (37.2 C)  SpO2: 100% 100%    Intake/Output Summary (Last 24 hours) at 03/18/2019 1252 Last data filed at 03/18/2019 0319 Gross per 24 hour  Intake -  Output 400 ml  Net -400 ml   Filed Weights   03/15/19 2123  Weight: 90.7 kg   Body mass index is 35.43 kg/m.   Physical Exam:  Physical Exam  Constitution:  Alert, cooperative, no distress,  Psychiatric: Normal and stable mood and affect, cognition intact,   HEENT: Normocephalic, PERRL, otherwise with in Normal limits  Chest:Chest symmetric Cardio vascular:  S1/S2, RRR, No murmure, No Rubs or Gallops  pulmonary: Clear to auscultation bilaterally, respirations unlabored, negative wheezes / crackles Abdomen: Soft, non-tender, non-distended, bowel sounds,no masses, no organomegaly Muscular skeletal: Limited exam - in bed, able to move all 4 extremities, Normal strength,  Neuro: CNII-XII intact. , normal motor and sensation, reflexes intact  Extremities: No pitting edema lower extremities, +2 pulses  Skin: Dry, warm to touch, negative for any Rashes, No open wounds Wounds: per nursing documentation       Data Review: I have personally reviewed the following laboratory data and  studies,  CBC: Recent Labs  Lab 03/15/19 2127 03/15/19 2140  WBC 6.6  --   NEUTROABS 4.2  --   HGB 8.8* 9.2*  HCT 28.9* 27.0*  MCV 105.9*  --   PLT 231  --    Basic Metabolic Panel: Recent Labs  Lab 03/15/19 2127 03/15/19 2140  NA 140 138  K 4.0 4.2  CL 104 103  CO2 25  --   GLUCOSE 152* 147*  BUN 42* 50*  CREATININE 2.35* 2.30*  CALCIUM 9.1  --    Liver Function Tests: Recent Labs  Lab 03/15/19 2127  AST 19  ALT 14  ALKPHOS 86  BILITOT 0.4  PROT 6.0*  ALBUMIN 2.5*   CBG: Recent Labs  Lab 03/15/19 2158  GLUCAP 136*   Recent Results (from the past 240 hour(s))  SARS Coronavirus 2     Status: None   Collection Time: 03/15/19 10:04 PM  Result Value Ref Range Status   SARS Coronavirus 2 NOT DETECTED NOT DETECTED Final    Comment: (NOTE) SARS-CoV-2 target nucleic acids are NOT DETECTED. The SARS-CoV-2 RNA is generally detectable in upper and lower respiratory specimens during the acute phase of infection.  Negative  results do not preclude SARS-CoV-2 infection, do not rule out co-infections with other pathogens, and should not be used as the sole basis for treatment or other patient management decisions.  Negative results must be combined with clinical observations, patient history, and epidemiological information. The expected result is Not Detected. Fact Sheet for Patients: http://www.biofiredefense.com/wp-content/uploads/2020/03/BIOFIRE-COVID -19-patients.pdf Fact Sheet for Healthcare Providers: http://www.biofiredefense.com/wp-content/uploads/2020/03/BIOFIRE-COVID -19-hcp.pdf This test is not yet approved or cleared by the [place] and  has been authorized for detection and/or diagnosis of SARS-CoV-2 by FDA under an Emergency Use Authorization (EUA).  This EUA will remain in effec t (meaning this test can be used) for the duration of  the COVID-19 declaration under Section 564(b)(1) of the Act, 21 U.S.C. section 360bbb-3(b)(1), unless  the authorization is terminated or revoked sooner. Performed at [facility], [place] [address], [place], [place] [address]      Studies: Vas US Carotid (at [facility] Only)  Result Date: 03/16/2019 Carotid Arterial Duplex Study Indications:       CVA. Risk Factors:   Summary: Right Carotid: Velocities in the right ICA are consistent with a 1-39% stenosis. Left Carotid: Velocities in the left ICA are consistent with a 1-39% stenosis. Vertebrals: Bilateral vertebral arteries demonstrate antegrade flow. *See table(s) above for measurements and observations.  Electronically signed by Antony Contras MD on 03/16/2019 at 10:45:34 AM.    Final    Vas [place] Transcranial Doppler  Result Date: 03/16/2019  Summary:  Absent bitemporal and occipital windows greatly limit exam.Normal blood flow directionsand velocities in both opthalmics and carotid siphons *See table(s) above for measurements and observations.  Diagnosing physician: Antony Contras MD Electronically signed by Antony Contras MD on 03/16/2019 at 10:47:25 AM.    Final     Scheduled Meds: . aspirin EC  81 mg Oral Daily  . atorvastatin  40 mg Oral q1800  . clopidogrel  75 mg Oral Daily  . heparin  5,000 Units Subcutaneous Q8H     SIGNED: Deatra James, MD, FACP, FHM. [facility],  Pager [phone]  If 7PM-7AM, please contact night-coverage [email or web address] [facility] 03/18/2019, 12:52 PM

## 2019-03-18 NOTE — Progress Notes (Signed)
STROKE TEAM PROGRESS NOTE   INTERVAL HISTORY Pt lying in bed, no complains. MRI limited showed b/l cerebellar, left frontal and right parietal infarcts, consistent with cardioembolic source. MRA negatvie. LE venous Doppler no DVT.  Pending loop recorder on Monday  Vitals:   03/17/19 1500 03/17/19 1940 03/17/19 2319 03/18/19 0319  BP: 100/80 (!) 101/56 (!) 113/51 119/62  Pulse: 67 70 (!) 54 64  Resp: 16 18 18 18   Temp: 98.4 F (36.9 C) 97.6 F (36.4 C) 97.8 F (36.6 C) 98 F (36.7 C)  TempSrc: Oral Oral Oral Oral  SpO2:  100% 100% 100%  Weight:      Height:        CBC:  Recent Labs  Lab 03/15/19 2127 03/15/19 2140  WBC 6.6  --   NEUTROABS 4.2  --   HGB 8.8* 9.2*  HCT 28.9* 27.0*  MCV 105.9*  --   PLT 231  --     Basic Metabolic Panel:  Recent Labs  Lab 03/15/19 2127 03/15/19 2140  NA 140 138  K 4.0 4.2  CL 104 103  CO2 25  --   GLUCOSE 152* 147*  BUN 42* 50*  CREATININE 2.35* 2.30*  CALCIUM 9.1  --    Lipid Panel:     Component Value Date/Time   CHOL 114 03/16/2019 0528   TRIG 61 03/16/2019 0528   HDL 57 03/16/2019 0528   CHOLHDL 2.0 03/16/2019 0528   VLDL 12 03/16/2019 0528   LDLCALC 45 03/16/2019 0528   HgbA1c:  Lab Results  Component Value Date   HGBA1C 5.3 03/16/2019   Urine Drug Screen: No results found for: LABOPIA, COCAINSCRNUR, LABBENZ, AMPHETMU, THCU, LABBARB  Alcohol Level No results found for: [facility]  IMAGING Mr Jodene Nam Head Wo Contrast  Result Date: 03/17/2019 CLINICAL DATA:  Stroke, follow-up. EXAM: MRI HEAD WITHOUT CONTRAST MRA HEAD WITHOUT CONTRAST TECHNIQUE: Multiplanar, multiecho pulse sequences of the brain and surrounding structures were obtained without intravenous contrast. Angiographic images of the head were obtained using MRA technique without contrast. COMPARISON:  None. FINDINGS: MRI HEAD FINDINGS Brain: By request of the neurologist, only diffusion-weighted images were performed. Acute nonhemorrhagic infarct is present in the  anterior left frontal lobe measuring up to 3 cm. A punctate infarct is present in the right parietal lobe cortex on image 88 of series 5. Bilateral linear cerebellar infarcts are present. Moderate periventricular and subcortical white matter changes are present bilaterally. Matter changes extend into the corona radiata bilaterally. Diffuse white matter changes are also present in the right and stem Vascular: Unable to assess on diffusion weighted imaging. Skull and upper cervical spine: Skull base is within normal limits on diffusion-weighted imaging. Sinuses/Orbits: No definite sinus disease. Orbits cannot be assessed on diffusion only imaging. Other: MRA HEAD FINDINGS The internal carotid arteries are within normal limits from the high cervical segments through the ICA termini. The A1 and M1 segments are normal. The anterior communicating artery is patent. ACA and MCA branch vessels are within normal limits. The vertebral arteries are codominant. Left PICA origin is visualized and normal. The right AICA is dominant. The basilar artery is normal. The left posterior cerebral artery is fed by similar-sized left posterior communicating arteries and a P1 segment. A fetal type right posterior cerebral artery is present. A small right P1 segment is noted. PCA branch vessels are within normal limits bilaterally. IMPRESSION: 1. Acute nonhemorrhagic infarct in the anterior left frontal lobe. 2. Punctate cortical acute nonhemorrhagic infarct in the right  parietal lobe. 3. Linear acute nonhemorrhagic infarcts involve the cerebellum bilaterally. 4. Infarcts of multiple territories suggest a central embolic source. 5. Diffuse white matter disease suggests chronic microvascular ischemia. 6. MRA circle-of-Willis demonstrates no significant proximal stenosis or occlusion. Distal disease is not evident on this study. Electronically Signed   By: San Morelle M.D.   On: 03/17/2019 16:42   Mr Brain Wo Contrast  Result  Date: 03/17/2019 CLINICAL DATA:  Stroke, follow-up. EXAM: MRI HEAD WITHOUT CONTRAST MRA HEAD WITHOUT CONTRAST TECHNIQUE: Multiplanar, multiecho pulse sequences of the brain and surrounding structures were obtained without intravenous contrast. Angiographic images of the head were obtained using MRA technique without contrast. COMPARISON:  None. FINDINGS: MRI HEAD FINDINGS Brain: By request of the neurologist, only diffusion-weighted images were performed. Acute nonhemorrhagic infarct is present in the anterior left frontal lobe measuring up to 3 cm. A punctate infarct is present in the right parietal lobe cortex on image 88 of series 5. Bilateral linear cerebellar infarcts are present. Moderate periventricular and subcortical white matter changes are present bilaterally. Matter changes extend into the corona radiata bilaterally. Diffuse white matter changes are also present in the right and stem Vascular: Unable to assess on diffusion weighted imaging. Skull and upper cervical spine: Skull base is within normal limits on diffusion-weighted imaging. Sinuses/Orbits: No definite sinus disease. Orbits cannot be assessed on diffusion only imaging. Other: MRA HEAD FINDINGS The internal carotid arteries are within normal limits from the high cervical segments through the ICA termini. The A1 and M1 segments are normal. The anterior communicating artery is patent. ACA and MCA branch vessels are within normal limits. The vertebral arteries are codominant. Left PICA origin is visualized and normal. The right AICA is dominant. The basilar artery is normal. The left posterior cerebral artery is fed by similar-sized left posterior communicating arteries and a P1 segment. A fetal type right posterior cerebral artery is present. A small right P1 segment is noted. PCA branch vessels are within normal limits bilaterally. IMPRESSION: 1. Acute nonhemorrhagic infarct in the anterior left frontal lobe. 2. Punctate cortical acute  nonhemorrhagic infarct in the right parietal lobe. 3. Linear acute nonhemorrhagic infarcts involve the cerebellum bilaterally. 4. Infarcts of multiple territories suggest a central embolic source. 5. Diffuse white matter disease suggests chronic microvascular ischemia. 6. MRA circle-of-Willis demonstrates no significant proximal stenosis or occlusion. Distal disease is not evident on this study. Electronically Signed   By: San Morelle M.D.   On: 03/17/2019 16:42   Vas US Carotid (at [place] Only)  Result Date: 03/16/2019 Carotid Arterial Duplex Study Indications:       CVA. Risk Factors:      Hypertension. Comparison Study:  no prior study Performing Technologist: Abram Sander RVS  Examination Guidelines: A complete evaluation includes B-mode imaging, spectral Doppler, color Doppler, and power Doppler as needed of all accessible portions of each vessel. Bilateral testing is considered an integral part of a complete examination. Limited examinations for reoccurring indications may be performed as noted.  Right Carotid Findings: +----------+--------+--------+--------+-----------+--------+           PSV cm/sEDV cm/sStenosisDescribe   Comments +----------+--------+--------+--------+-----------+--------+ CCA Prox  60      10              homogeneous         +----------+--------+--------+--------+-----------+--------+ CCA Distal73      17              homogeneous         +----------+--------+--------+--------+-----------+--------+  ICA Prox  63      11      1-39%   homogeneous         +----------+--------+--------+--------+-----------+--------+ ICA Distal75      25                                  +----------+--------+--------+--------+-----------+--------+ ECA       103                                         +----------+--------+--------+--------+-----------+--------+ +----------+--------+-------+--------+-------------------+           PSV cm/sEDV  cmsDescribeArm Pressure (mmHG) +----------+--------+-------+--------+-------------------+ [id number]                                        +----------+--------+-------+--------+-------------------+ +---------+--------+--+--------+--+---------+ VertebralPSV cm/s52EDV cm/s11Antegrade +---------+--------+--+--------+--+---------+  Left Carotid Findings: +----------+--------+--------+--------+-----------+---------------------------+           PSV cm/sEDV cm/sStenosisDescribe   Comments                    +----------+--------+--------+--------+-----------+---------------------------+ CCA Prox  93      23              homogeneous                            +----------+--------+--------+--------+-----------+---------------------------+ CCA Distal106     23              homogeneous                            +----------+--------+--------+--------+-----------+---------------------------+ ICA Prox                  1-39%   homogeneousvelocity image not captured +----------+--------+--------+--------+-----------+---------------------------+ ICA Distal75      21                                                     +----------+--------+--------+--------+-----------+---------------------------+ ECA       75                                                             +----------+--------+--------+--------+-----------+---------------------------+ +----------+--------+--------+--------+-------------------+ SubclavianPSV cm/sEDV cm/sDescribeArm Pressure (mmHG) +----------+--------+--------+--------+-------------------+           82                                          +----------+--------+--------+--------+-------------------+ +---------+--------+--+--------+--+ VertebralPSV cm/s55EDV cm/s10 +---------+--------+--+--------+--+  Summary: Right Carotid: Velocities in the right ICA are consistent with a 1-39% stenosis. Left Carotid: Velocities in the left  ICA are consistent with a 1-39% stenosis. Vertebrals: Bilateral vertebral arteries demonstrate antegrade flow. *See table(s) above for measurements and observations.  Electronically signed by Antony Contras MD on 03/16/2019 at  10:45:34 AM.    Final    Vas [place] Lower Extremity Venous (dvt)  Result Date: 03/17/2019  Lower Venous Study Indications: Stroke.  Limitations: Body habitus and skin texture. Comparison Study: No prior study on file for comparison Performing Technologist: Sharion Dove RVS  Examination Guidelines: A complete evaluation includes B-mode imaging, spectral Doppler, color Doppler, and power Doppler as needed of all accessible portions of each vessel. Bilateral testing is considered an integral part of a complete examination. Limited examinations for reoccurring indications may be performed as noted.  +---------+---------------+---------+-----------+----------+--------------+ RIGHT    CompressibilityPhasicitySpontaneityPropertiesSummary        +---------+---------------+---------+-----------+----------+--------------+ CFV      Full           Yes      Yes                                 +---------+---------------+---------+-----------+----------+--------------+ SFJ      Full                                                        +---------+---------------+---------+-----------+----------+--------------+ FV Prox  Full                                                        +---------+---------------+---------+-----------+----------+--------------+ FV Mid   Full                                                        +---------+---------------+---------+-----------+----------+--------------+ FV DistalFull                                                        +---------+---------------+---------+-----------+----------+--------------+ PFV      Full                                                         +---------+---------------+---------+-----------+----------+--------------+ POP      Full           Yes      Yes                                 +---------+---------------+---------+-----------+----------+--------------+ PTV                                                   Not visualized +---------+---------------+---------+-----------+----------+--------------+ PERO  Not visualized +---------+---------------+---------+-----------+----------+--------------+   +---------+---------------+---------+-----------+----------+--------------+ LEFT     CompressibilityPhasicitySpontaneityPropertiesSummary        +---------+---------------+---------+-----------+----------+--------------+ CFV      Full           Yes      Yes                                 +---------+---------------+---------+-----------+----------+--------------+ SFJ      Full                                                        +---------+---------------+---------+-----------+----------+--------------+ FV Prox  Full                                                        +---------+---------------+---------+-----------+----------+--------------+ FV Mid   Full                                                        +---------+---------------+---------+-----------+----------+--------------+ FV DistalFull                                                        +---------+---------------+---------+-----------+----------+--------------+ PFV      Full                                                        +---------+---------------+---------+-----------+----------+--------------+ POP      Full           Yes      Yes                                 +---------+---------------+---------+-----------+----------+--------------+ PTV                                                   Not visualized  +---------+---------------+---------+-----------+----------+--------------+ PERO                                                  Not visualized +---------+---------------+---------+-----------+----------+--------------+     Summary: Right: There is no evidence of deep vein thrombosis in the lower extremity. However, portions of this examination were limited- see technologist comments above. Left: There is no evidence of deep vein thrombosis in the lower extremity. However, portions of this examination were limited- see technologist comments above.  *  See table(s) above for measurements and observations.    Preliminary    Vas [place] Transcranial Doppler  Result Date: 03/16/2019  Transcranial Doppler Indications: Stroke. Limitations for diagnostic windows: Unable to insonate right transtemporal window. Unable to insonate left transtemporal window. Performing Technologist: Abram Sander RVS  Examination Guidelines: A complete evaluation includes B-mode imaging, spectral Doppler, color Doppler, and power Doppler as needed of all accessible portions of each vessel. Bilateral testing is considered an integral part of a complete examination. Limited examinations for reoccurring indications may be performed as noted.  +----------+-------------+----------+-----------+-------+ RIGHT TCD Right VM (cm)Depth (cm)PulsatilityComment +----------+-------------+----------+-----------+-------+ Opthalmic     24.00                 1.71            +----------+-------------+----------+-----------+-------+ ICA siphon    43.00                 1.47            +----------+-------------+----------+-----------+-------+  +----------+------------+----------+-----------+-------+ LEFT TCD  Left VM (cm)Depth (cm)PulsatilityComment +----------+------------+----------+-----------+-------+ Opthalmic    23.00                 1.79            +----------+------------+----------+-----------+-------+ ICA siphon   44.00                                  +----------+------------+----------+-----------+-------+  Summary:  Absent bitemporal and occipital windows greatly limit exam.Normal blood flow directionsand velocities in both opthalmics and carotid siphons *See table(s) above for measurements and observations.  Diagnosing physician: Antony Contras MD Electronically signed by Antony Contras MD on 03/16/2019 at 10:47:25 AM.    Final     PHYSICAL EXAM Pleasant elderly Caucasian lady who is hard of hearing.  Not in distress. . Afebrile. Head is nontraumatic. Neck is supple without bruit.    Cardiac exam no murmur or gallop. Lungs are clear to auscultation. Distal pulses are well felt. Neurological Exam ;  Awake  Alert oriented x 3.  Slightly nonfluent hesitant speech and delay in her responses.  Mild difficulty with naming and repetition.  Able to name only 9 animals which walk on 4 legs.  Poor recall 0/3.  Eye movements full without nystagmus but mild saccadic dysmetria on right gaze.fundi were not visualized. Vision acuity and fields appear normal. Hearing is normal. Palatal movements are normal. Face symmetric. Tongue midline. Normal strength, tone, reflexes and coordination. Normal sensation. Gait deferred.  ASSESSMENT/PLAN Ms. Jillian Guerra is a 78 y.o. female with history of uterine cancer, HTN, HLD, DVT presenting with difficulty with word finding for a few days.   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   LE venous doppler no DVT   Does not think TEE needed at this time  Recommend loop recorder to rule out A. Fib tomorrow  LDL 45  HgbA1c 5.3  Heparin 5000 units sq tid for VTE prophylaxis  aspirin 81 mg daily prior to admission, now on aspirin 81 mg and  plavix 75 mg daily x 3 weeks, then PLAVIX alone.  Therapy recommendations:  HH PT   Disposition:  Return home  Hypertension  Stable - on the low side . 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 After hours, contact General Neurology

## 2019-03-18 NOTE — Progress Notes (Signed)
Pt verbalized to me earlier in the shift that the daughter dropped off a bag for her downstairs at the main lobby since 3pm, she requested to brush her teeth as her toiletries were in the bag, a new tooth brush and paste was given to pt, she brushed her teeth and was assisted to bed at 2100.Pt was told that I will send someone down to check, Itzel my NT went down to the main lobby and the Security office in the ED, no bag belonging to the pt was seen. The pt's daughter called and was upset that the bag has not been giving to the pt, same was explained to her we are trying to locate where the bag is. Charge Nurse Kyung Rudd was also notified who also followed up with Security, this RN also called and spoke with the Security staff who said they will check with day shift Security tomorrow morning, pt was however reassured and will continue to monitor. Obasogie-Asidi, Janaia Kozel Efe

## 2019-03-19 ENCOUNTER — Encounter (HOSPITAL_COMMUNITY): Payer: Self-pay | Admitting: Certified Registered"

## 2019-03-19 ENCOUNTER — Other Ambulatory Visit (HOSPITAL_COMMUNITY): Payer: Medicare Other

## 2019-03-19 ENCOUNTER — Encounter (HOSPITAL_COMMUNITY)
Admission: EM | Disposition: A | Discharge status: Home Health | Payer: Self-pay | Source: Home / Self Care | Attending: Family Medicine

## 2019-03-19 DIAGNOSIS — I639 Cerebral infarction, unspecified: Secondary | ICD-10-CM

## 2019-03-19 SURGERY — CANCELLED PROCEDURE

## 2019-03-19 MED ORDER — ATORVASTATIN CALCIUM 10 MG PO TABS: 20.0000 mg | ORAL_TABLET | Freq: Every day | ORAL | Status: DC

## 2019-03-19 MED ORDER — APIXABAN 5 MG PO TABS: 5.0000 mg | ORAL_TABLET | Freq: Two times a day (BID) | ORAL | 0 refills | Status: AC

## 2019-03-19 MED ORDER — ATORVASTATIN CALCIUM 20 MG PO TABS: 20.0000 mg | ORAL_TABLET | Freq: Every day | ORAL | 0 refills | Status: AC

## 2019-03-19 MED ORDER — APIXABAN 5 MG PO TABS
5.0000 mg | ORAL_TABLET | Freq: Two times a day (BID) | ORAL | Status: DC
  Administered 2019-03-19: 5 mg via ORAL
  Filled 2019-03-19: qty 1

## 2019-03-19 NOTE — Progress Notes (Signed)
Physical Therapy Treatment Patient Details Name: Jillian Guerra MRN: [id number] DOB: January 06, 1941 Today's Date: 03/19/2019    History of Present Illness Patient is a 78 y/o female who presents with speech difficulties. 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 PMH includes uterine ca, HTN,DVT.    PT Comments    Patient seen for mobility progression. Pt requires min guard assist for bed mobility and gait training and min A for stair training this session. Pt with 2/4 DOE with ambulation and HR elevated up to 139 bpm requiring seated rest break. Pt will continue to benefit from further skilled PT services to maximize independence and safety with mobility.     Follow Up Recommendations  Home health PT;Supervision - Intermittent(continue HH services)     Equipment Recommendations  None recommended by PT    Recommendations for Other Services       Precautions / Restrictions Precautions Precautions: Fall Precaution Comments: permanent catheter Restrictions Weight Bearing Restrictions: No    Mobility  Bed Mobility Overal bed mobility: Modified Independent             General bed mobility comments: HOB elevated, use of rail.  Transfers Overall transfer level: Needs assistance Equipment used: Rolling walker (2 wheeled) Transfers: Sit to/from Stand Sit to Stand: Min guard         General transfer comment: min guard for safety; cues for safe hand placement  Ambulation/Gait Ambulation/Gait assistance: Min guard Gait Distance (Feet): (119ft X 2 trials with seated rest break) Assistive device: Rolling walker (2 wheeled) Gait Pattern/deviations: Step-through pattern;Decreased stride length;Wide base of support Gait velocity: decreased   General Gait Details: decreased cadence and seated rest break required due to 2/4 DOE and HR elevated up to 139 bpm   Stairs Stairs: Yes Stairs assistance: Min assist Stair  Management: Two rails;Step to pattern;Forwards Number of Stairs: 2 General stair comments: cues for sequencing; assist to ascend due to difficulty with knee flexion; 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                            Cognition Arousal/Alertness: Awake/alert Behavior During Therapy: WFL for tasks assessed/performed Overall Cognitive Status: Difficult to assess(aphasia)                                 General Comments: pt continues to demonstrate word finding difficulties and slow processing      Exercises      General Comments        Pertinent Vitals/Pain Pain Assessment: No/denies pain    Home Living                      Prior Function            PT Goals (current goals can now be found in the care plan section) Acute Rehab PT Goals Patient Stated Goal: to return home Progress towards PT goals: Progressing toward goals    Frequency  Min 4X/week      PT Plan Current plan remains appropriate    Co-evaluation              AM-PAC PT "6 Clicks" Mobility   Outcome Measure  Help needed turning from your back to your side while in a flat bed without using bedrails?: A Little Help needed moving from lying on your back to sitting on the side of a flat bed without using bedrails?: A Little Help needed moving to and from a bed to a chair (including a wheelchair)?: None Help needed standing up from a chair using  your arms (e.g., wheelchair or bedside chair)?: None Help needed to walk in hospital room?: A Little Help needed climbing 3-5 steps with a railing? : A Little 6 Click Score: 20    End of Session Equipment Utilized During Treatment: Gait belt Activity Tolerance: Patient tolerated treatment well Patient left: in chair;with call bell/phone within reach;with chair alarm set Nurse Communication: Mobility status PT Visit Diagnosis: Difficulty in walking, not elsewhere classified (R26.2)     Time: 4037-5436 PT Time Calculation (min) (ACUTE ONLY): 23 min  Charges:  $Gait Training: 23-37 mins                     Earney Navy, PTA Acute Rehabilitation Services Pager: [phone] Office: [phone]     Darliss Cheney 03/19/2019, 2:26 PM

## 2019-03-19 NOTE — Consult Note (Addendum)
ELECTROPHYSIOLOGY CONSULT NOTE  Patient ID: Jillian Guerra MRN: [id number], DOB/AGE: 77/29/1942   Admit date: 03/15/2019 Date of Consult: 03/19/2019  Primary Physician: Curly Rim, MD Primary Cardiologist: none Reason for Consultation: Cryptogenic stroke ; recommendations regarding Implantable Loop Recorder, requested by Dr. Erlinda Hong  History of Present Illness VRINDA Guerra was admitted on 03/15/2019 with stroke.  They first developed word finding difficulty for a few days prior to arrival. PMHx includes HTN, HLD, prior DVT Neurology notes: Bilateral anterior and posterior nfarcts felt to be embolic secondary to unknown etiology.  she has undergone workup for stroke including echocardiogram and carotid dopplers.  She has had limited though negative TCD and LE venous dopplers.  Neuro does not feel need for TEE    Echocardiogram this admission demonstrated   IMPRESSIONS 1. The left ventricle has mildly reduced systolic function, with an ejection fraction of 45-50%. The cavity size was normal. There is mildly increased left ventricular wall thickness. Left ventricular diastolic Doppler parameters are consistent with  impaired relaxation.  2. The right ventricle has normal systolic function. The cavity was mildly enlarged. There is no increase in right ventricular wall thickness. Right ventricular systolic pressure is moderately elevated with an estimated pressure of 50.6 mmHg.  3. No evidence of mitral valve stenosis.  4. Pulmonary hypertension is moderate.  5. The interatrial septum was not assessed.  FINDINGS  Left Ventricle: The left ventricle has mildly reduced systolic function, with an ejection fraction of 45-50%. The cavity size was normal. There is mildly increased left ventricular wall thickness. Left ventricular diastolic Doppler parameters are  consistent with impaired relaxation.  Right Ventricle: The right ventricle has normal systolic function. The cavity was mildly  enlarged. There is no increase in right ventricular wall thickness. Right ventricular systolic pressure is moderately elevated with an estimated pressure of 50.6  mmHg.  Left Atrium: Left atrial size was normal in size.  Right Atrium: Right atrial size was normal in size. Right atrial pressure is estimated at 8 mmHg.  Interatrial Septum: The interatrial septum was not assessed.  Pericardium: There is no evidence of pericardial effusion.  Mitral Valve: The mitral valve is normal in structure. Mitral valve regurgitation is not visualized by color flow Doppler. No evidence of mitral valve stenosis.  Tricuspid Valve: The tricuspid valve is normal in structure. Tricuspid valve regurgitation was not visualized by color flow Doppler.  Aortic Valve: The aortic valve is normal in structure. Aortic valve regurgitation was not visualized by color flow Doppler.  Pulmonic Valve: The pulmonic valve was grossly normal. Pulmonic valve regurgitation is not visualized by color flow Doppler.  Pulmonary Artery: Pulmonary hypertension is moderate.  Lab work is reviewed.  Prior to admission, the patient denies chest pain, shortness of breath, dizziness, palpitations, or syncope.  They are recovering from their stroke with plans to home at discharge.    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   Radiation 1998   hx of   Uterine cancer [facility])      Surgical History:  Past Surgical History:  Procedure Laterality Date   APPENDECTOMY     CHOLECYSTECTOMY     CYSTOSCOPY/RETROGRADE/URETEROSCOPY Bilateral 05/08/2013   Procedure: CYSTOSCOPY/BLADDER BIOPSY/BILATERAL RETROGRADE  PYELOGRAM;  Surgeon: Fredricka Bonine, MD;  Location: [facility];  Service: Urology;  Laterality:  Bilateral;  left ureteroscpoy   STAPEDECTOMY Bilateral 2002,2013   TOTAL ABDOMINAL HYSTERECTOMY  1998     Medications Prior to Admission  Medication Sig Dispense Refill Last Dose   acetaminophen (TYLENOL) 500 MG tablet Take 1,000 mg by mouth every 6 (six) hours as needed for mild pain.    Past Week at prn   amoxicillin-clavulanate (AUGMENTIN) 500-125 MG tablet Take 1 tablet by mouth 2 (two) times a day.   03/15/2019 at Unknown time   aspirin EC 81 MG tablet Take 81 mg by mouth daily.   03/15/2019 at Unknown time   Cholecalciferol 10 MCG (400 UNIT) CAPS Take 400 mg by mouth daily.   03/15/2019 at Unknown time   folic acid (FOLVITE) 1 MG tablet Take 1 mg by mouth daily.   03/15/2019 at Unknown time   furosemide (LASIX) 40 MG tablet Take 60 mg by mouth daily.   03/14/2019   simvastatin (ZOCOR) 20 MG tablet Take 20 mg by mouth at bedtime.    03/14/2019   HYDROcodone-acetaminophen (NORCO/VICODIN) 5-325 MG per tablet Take 1-2 tablets by mouth every 6 (six) hours as needed. (Patient not taking: Reported on 03/16/2019) 10 tablet 0 Not Taking at Unknown time   ondansetron (ZOFRAN ODT) 4 MG disintegrating tablet Take 1 tablet (4 mg total) by mouth every 8 (eight) hours as needed for nausea or vomiting. (Patient not taking: Reported on 03/16/2019) 20 tablet 0 Not Taking at Unknown time    Inpatient Medications:   aspirin EC  81 mg Oral Daily   atorvastatin  40 mg Oral q1800   clopidogrel  75 mg Oral Daily   heparin  5,000 Units Subcutaneous Q8H    Allergies:  Allergies  Allergen Reactions   Ciprofloxacin    Other Nausea And Vomiting   Sulfa Antibiotics Nausea Only    Social History   Socioeconomic History   Marital status: Divorced    Spouse name: Not on file   Number of children: Not on file   Years of education: Not on file   Highest education level: Not on file  Occupational History   Occupation: retired Financial trader strain: Not  on file   Food insecurity    Worry: Not on file    Inability: Not on file   Transportation needs    Medical: Not on file    Non-medical: Not on file  Tobacco Use   Smoking status: Never Smoker   Smokeless tobacco: Never Used  Substance and Sexual Activity   Alcohol use: No   Drug use: No   Sexual activity: Not on file  Lifestyle   Physical activity    Days per week: Not on file    Minutes per session: Not on file   Stress: Not on file  Relationships   Social connections    Talks on phone: Not on file    Gets together: Not on file    Attends religious service: Not on file    Active member of club or organization: Not on file    Attends meetings of clubs or organizations: Not on file    Relationship status: Not on file   Intimate partner violence    Fear of current or ex partner: Not on file    Emotionally abused: Not on file    Physically abused: Not on file    Forced  sexual activity: Not on file  Other Topics Concern   Not on file  Social History Narrative   Lives with daughter right now but from [place]     Family History  Problem Relation Age of Onset   Diabetes Sister    Stroke Father       Review of Systems: All other systems reviewed and are otherwise negative except as noted above.  Physical Exam: Vitals:   03/18/19 1930 03/18/19 2321 03/19/19 0332 03/19/19 0755  BP: 121/64 114/60 113/66 (!) 99/52  Pulse: 77 (!) 51 (!) 59 (!) 51  Resp: 18 18 18 20   Temp: 98 F (36.7 C) 98.3 F (36.8 C) 98 F (36.7 C) 98.9 F (37.2 C)  TempSrc: Oral Oral Oral Oral  SpO2: 100% 100% 100% 100%  Weight:      Height:       LIMITED visual exam given COVID19 pandemic GEN- The patient is well appearing, alert and oriented x 3 today.   Head- normocephalic, atraumatic Eyes-  Sclera clear, conjunctiva pink Ears- hearing intact Neck- supple Lungs- normal work of breathing Heart- telemetry and echo are reviewed  Extremities- no clubbing, cyanosis, or edema MS-  no significant deformity or atrophy Skin- no rash or lesion Psych- euthymic mood, full affect   Labs:   Lab Results  Component Value Date   WBC 6.6 03/15/2019   HGB 9.2 (L) 03/15/2019   HCT 27.0 (L) 03/15/2019   MCV 105.9 (H) 03/15/2019   PLT 231 03/15/2019    Recent Labs  Lab 03/15/19 2127 03/15/19 2140  NA 140 138  K 4.0 4.2  CL 104 103  CO2 25  --   BUN 42* 50*  CREATININE 2.35* 2.30*  CALCIUM 9.1  --   PROT 6.0*  --   BILITOT 0.4  --   ALKPHOS 86  --   ALT 14  --   AST 19  --   GLUCOSE 152* 147*   No results found for: CKTOTAL, CKMB, CKMBINDEX, TROPONINI Lab Results  Component Value Date   CHOL 114 03/16/2019   Lab Results  Component Value Date   HDL 57 03/16/2019   Lab Results  Component Value Date   LDLCALC 45 03/16/2019   Lab Results  Component Value Date   TRIG 61 03/16/2019   Lab Results  Component Value Date   CHOLHDL 2.0 03/16/2019   No results found for: LDLDIRECT  No results found for: DDIMER   Radiology/Studies:   Mr Virgel Paling ZH Contrast Result Date: 03/17/2019 CLINICAL DATA:  Stroke, follow-up. EXAM: MRI HEAD WITHOUT CONTRAST MRA HEAD WITHOUT CONTRAST TECHNIQUE: Multiplanar, multiecho pulse sequences of the brain and surrounding structures were obtained without intravenous contrast. Angiographic images of the head were obtained using MRA technique without contrast. COMPARISON:  None. FINDINGS: MRI HEAD FINDINGS Brain: By request of the neurologist, only diffusion-weighted images were performed. Acute nonhemorrhagic infarct is present in the anterior left frontal lobe measuring up to 3 cm. A punctate infarct is present in the right parietal lobe cortex on image 88 of series 5. Bilateral linear cerebellar infarcts are present. Moderate periventricular and subcortical white matter changes are present bilaterally. Matter changes extend into the corona radiata bilaterally. Diffuse white matter changes are also present in the right and stem  Vascular: Unable to assess on diffusion weighted imaging. Skull and upper cervical spine: Skull base is within normal limits on diffusion-weighted imaging. Sinuses/Orbits: No definite sinus disease. Orbits cannot be assessed on diffusion only imaging. Other:  MRA HEAD FINDINGS The internal carotid arteries are within normal limits from the high cervical segments through the ICA termini. The A1 and M1 segments are normal. The anterior communicating artery is patent. ACA and MCA branch vessels are within normal limits. The vertebral arteries are codominant. Left PICA origin is visualized and normal. The right AICA is dominant. The basilar artery is normal. The left posterior cerebral artery is fed by similar-sized left posterior communicating arteries and a P1 segment. A fetal type right posterior cerebral artery is present. A small right P1 segment is noted. PCA branch vessels are within normal limits bilaterally. IMPRESSION: 1. Acute nonhemorrhagic infarct in the anterior left frontal lobe. 2. Punctate cortical acute nonhemorrhagic infarct in the right parietal lobe. 3. Linear acute nonhemorrhagic infarcts involve the cerebellum bilaterally. 4. Infarcts of multiple territories suggest a central embolic source. 5. Diffuse white matter disease suggests chronic microvascular ischemia. 6. MRA circle-of-Willis demonstrates no significant proximal stenosis or occlusion. Distal disease is not evident on this study. Electronically Signed   By: San Morelle M.D.   On: 03/17/2019 16:42      Vas US Carotid (at [facility] Only) Result Date: 03/16/2019 Carotid Arterial Duplex Study Indications:       CVA. Risk Factors:      Hypertension. Comparison Study:  no prior study Performing Technologist: Abram Sander RVS  Examination Guidelines: A complete evaluation includes B-mode imaging, spectral Doppler, color Doppler, and power Doppler as needed of all accessible portions of each vessel. Bilateral testing is considered  an integral part of a complete examination. Limited examinations for reoccurring indications may be performed as noted.  Right Carotid Findings: \ Summary: Right Carotid: Velocities in the right ICA are consistent with a 1-39% stenosis. Left Carotid: Velocities in the left ICA are consistent with a 1-39% stenosis. Vertebrals: Bilateral vertebral arteries demonstrate antegrade flow. *See table(s) above for measurements and observations.  Electronically signed by Antony Contras MD on 03/16/2019 at 10:45:34 AM.    Final     Vas [place] Lower Extremity Venous (dvt) Result Date: 03/18/2019  Lower Venous Study Indications: Stroke.  Limitations: Body habitus and skin texture. Comparison Study: No prior study on file for comparison Performing Technologist: Sharion Dove RVS  Examination Guidelines: A complete evaluation includes B-mode imaging, spectral Doppler, color Doppler, and power Doppler as needed of all accessible portions of each vessel. Bilateral testing is considered an integral part of a complete examination. Limited examinations for reoccurring indications may be performed as noted.      Summary: Right: There is no evidence of deep vein thrombosis in the lower extremity. However, portions of this examination were limited- see technologist comments above. Left: There is no evidence of deep vein thrombosis in the lower extremity. However, portions of this examination were limited- see technologist comments above.  *See table(s) above for measurements and observations. Electronically signed by Harold Barban MD on 03/18/2019 at 4:36:22 PM.    Final     Vas [place] Transcranial Doppler Result Date: 03/16/2019  Transcranial Doppler Indications: Stroke. Limitations for diagnostic windows: Unable to insonate right transtemporal window. Unable to insonate left transtemporal window. Performing Technologist: Abram Sander RVS  Examination Guidelines: A complete evaluation includes B-mode imaging, spectral Doppler, color  Doppler, and power Doppler as needed of all accessible portions of each vessel. Bilateral testing is considered an integral part of a complete examination. Limited examinations for reoccurring indications may be performed as noted.  Summary:  Absent bitemporal and occipital windows greatly limit  exam.Normal blood flow directionsand velocities in both opthalmics and carotid siphons *See table(s) above for measurements and observations.  Diagnosing physician: Antony Contras MD Electronically signed by Antony Contras MD on 03/16/2019 at 10:47:25 AM.    Final     12-lead ECG SR, PACs, blocked PACs All prior EKG's in EPIC reviewed with no documented atrial fibrillation  Telemetry SR, she also has an ectopic atrial rhythm, she has very frequent PACs.  She has had a few periods of elevated HR's 120's-130's, often associated with baseline motion and artifact, though is irregular  Assessment and Plan:  1. Cryptogenic stroke The patient presents with cryptogenic stroke.  I suspect she is having PAFib on telemetry.  She has had rates 120's-130's, at times looks a strial with PACs, and others looks more like Afib. I will review her tracings with Dr. Lovena Le I do not anticipate loop implant.  If he does not this iti siAFib, would first plan for event monitoring given her frequent ectopy.  Dr. Lovena Le will see/examine the patient later this morning  ADDEND: I have reviewed telemetry strips with Dr. Lovena Le, agrees the patient has had AFib.  I have discussed with Dr. Erlinda Hong, defer to him anticoagulation choice and timing.   Renee Dyane Dustman, PA-C 03/19/2019  EP Attending   Agree with above. The patient has evidence of atrial fib. No indication for ILR.   Mikle Bosworth.D.

## 2019-03-19 NOTE — TOC Transition Note (Signed)
Transition of Care [facility]) - CM/SW Discharge Note   Patient Details  Name: Jillian Guerra MRN: [id number] Date of Birth: Jun 12, 1941  [facility] CM/SW Contact:  Pollie Friar, RN Phone Number: 03/19/2019, 1:49 PM   Clinical Narrative:    Pt discharging home. [name] with [facility] aware of d/c.  Pt started on Eliquis. CM provided her 30 day free card and she will f/u with her pharmacy on the cost.  Pt has transportation home.   Final next level of care: Home w Home Health Services Barriers to Discharge: Continued Medical Work up   Patient Goals and CMS Choice   CMS Medicare.gov Compare Post Acute Care list provided to:: Patient Choice offered to / list presented to : Patient  Discharge Placement                       Discharge Plan and Services   Discharge Planning Services: CM Consult Post Acute Care Choice: Home Health                    HH Arranged: PT, OT, Speech Therapy [place] Agency: [place] ([facility]) Date [facility] Agency Contacted: 03/16/19   Representative spoke with at [place]: [place] ([place]) Interventions     Readmission Risk Interventions No flowsheet data found.

## 2019-03-19 NOTE — Plan of Care (Signed)
Adequate for discharge.

## 2019-03-19 NOTE — Progress Notes (Signed)
OT Cancellation Note  Patient Details Name: Jillian Guerra MRN: [id number] DOB: [birth year]   Cancelled Treatment:    Reason Eval/Treat Not Completed: Patient at procedure or test/ unavailable.  Pt working with PT.  Lucille Passy, OTR/L [place] Pager [phone] Office [phone]   Lucille Passy M 03/19/2019, 3:30 PM

## 2019-03-19 NOTE — Discharge Summary (Signed)
Physician Discharge Summary  Jillian Guerra JXB:[id number] DOB: 10/27/1940 DOA: 03/15/2019  PCP: Curly Rim, MD  Admit date: 03/15/2019 Discharge date: 03/19/2019  Admitted From: Home Disposition:  Home  Discharge Condition:Stable CODE STATUS: DNR Diet recommendation: Heart Healthy  Brief/Interim Summary: HPI: Jillian Guerra is a 78 y.o. 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  Patient developed word finding difficulty intermittently yesterday and was seen by her PCP on 03/15/2019 for further evaluation.  She had no other symptoms including focal numbness/weakness, change in vision, headache, gait instability, chest pain, dyspnea, palpitations, or change in memory.  An MRI brain was obtained which revealed an acute infarct of the left posterior frontal lobe with acute/subacute infarcts in the cerebellar hemispheres bilaterally (radiology report available in care everywhere).  She has no prior known history of stroke.  She has a chronic indwelling Foley catheter which was exchanged this morning.  Hospital Course: Her hospital course remained stable.  She was seen by neurology after admission.  She was started on dual antiplatelet therapy.  Physical therapy/Occupational Therapy evaluated her and recommended home health on discharge. EP was consulted for possible loop recorder placement.  But telemetry recordings showed she had atrial fibrillation.  She has been started on Eliquis 5 mg twice a day. Plan is to continue eliquis.  She will follow-up with her PCP, nephrologist and neurology as an  outpatient.  Following problems were addressed during her hospitalization:  Acute posterior frontal lobe infarct/subacute bilateral cerebellar infarcts: -Remained stable, no progression of neurological symptoms  - MRI at the outside hospital available in care everywhere.  Seen by neurology in the hospital.   - MRA: IMPRESSION: 1. Acute nonhemorrhagic infarct in the anterior left frontal lobe. 2. Punctate cortical acute nonhemorrhagic infarct in the right parietal lobe. 3. Linear acute nonhemorrhagic infarcts involve the cerebellum bilaterally. 4. Infarcts of multiple territories suggest a central embolic source. 5. Diffuse white matter disease suggests chronic microvascular ischemia. 6. MRA circle-of-Willis demonstrates no significant proximal stenosis or occlusion. Distal disease is not evident on this study.   -Duplex shows less than 39% stenosis bilaterally.   -Transcranial Doppler was limited.  - Hemoglobin A1c of 5.3.    -Neurology recommended aspirin and plavix  for 3 weeks, then Plavix alone. -She has been started on lipitor -  PT has recommended home health PT.  -2D echocardiogram revealed mild reduction in the ejection fraction to 45 to 50% -Tele recording confirmed A. fib.  Started on Eliquis.  No need of loop recorder placement.  Dual antiplatelet therapy stopped - Patient has been seen by speech therapy and continued speech therapy on discharge.  CKD stage IV : -Currently stable Secondarytoobstructive uropathy and suspected FSGS. Follows with nephrology, Dr. Alger Simons, in [place].  Will follow creatinine levels.  Patient will need to follow-up with nephrology as outpatient.She is on lasix at home  Urinary retention with chronic indwelling Foley: -Stable no changes Chronic urinary retention reportedly current after pelvic radiation for prior endometrial cancer. Foley catheter in place last exchange 03/15/2019 per patient. Continue Foley  care.  Anemia of chronic disease: -Monitoring H&H, currently stable,  Hyperlipidemia: We will change simvastatin to atorvastatin on  discharge.  Hypotensive -Her BP remains soft but stable     Discharge Diagnoses:  Principal Problem:   Acute CVA (cerebrovascular accident) ([place]) Active Problems:   CKD (chronic kidney disease), stage IV ([place])   Hyperlipidemia   Urinary retention    Discharge Instructions  Discharge Instructions    Ambulatory referral to Neurology   Complete by: As directed    An appointment is requested in approximately: 4 weeks   Ambulatory referral to Neurology   Complete by: As directed    Follow up with stroke clinic NP (Jessica Vanschaick or Cecille Rubin, if both not available, consider Dr. Antony Contras, Dr. Bess Harvest, or Dr. Sarina Ill) at [facility] in about 4 weeks.   Diet - low sodium heart healthy   Complete by: As directed    Discharge instructions   Complete by: As directed    1)Please follow up with your PCP and nephrologist as an outpatient. 2)Follow up with neurology in 4 weeks.  Name and number the provider group has been attached 3)Take prescribed medications as instructed.   Increase activity slowly   Complete by: As directed      Allergies as of 03/19/2019      Reactions   Ciprofloxacin    Other Nausea And Vomiting   Sulfa Antibiotics Nausea Only      Medication List    STOP taking these medications   amoxicillin-clavulanate 500-125 MG tablet Commonly known as: AUGMENTIN   aspirin EC 81 MG tablet   HYDROcodone-acetaminophen 5-325 MG tablet Commonly known as: NORCO/VICODIN   ondansetron 4 MG disintegrating tablet Commonly known as: Zofran ODT   simvastatin 20 MG tablet Commonly known as: ZOCOR     TAKE these medications   acetaminophen 500 MG tablet Commonly known as: TYLENOL Take 1,000 mg by mouth every 6 (six) hours as needed for mild pain.   apixaban 5 MG Tabs tablet Commonly  known as: ELIQUIS Take 1 tablet (5 mg total) by mouth 2 (two) times daily.   atorvastatin 20 MG tablet Commonly known as: LIPITOR Take 1 tablet (20 mg total) by mouth daily at 6 PM.   Cholecalciferol 10 MCG (400 UNIT) Caps Take 400 mg by mouth daily.   folic acid 1 MG tablet Commonly known as: FOLVITE Take 1 mg by mouth daily.   furosemide 40 MG tablet Commonly known as: LASIX Take 60 mg by mouth daily.      Follow-up Information    Corrington, Kip A, MD. Schedule an appointment as soon as possible for a visit in 1 week(s).   Specialty: Family Medicine Contact information: [address] [place] [address] [phone]        [facility]. Schedule an appointment as soon as possible for a visit in 4 week(s).   Specialty: Neurology Contact information: [place] [phone]         Allergies  Allergen Reactions  . Ciprofloxacin   . Other Nausea And Vomiting  . Sulfa Antibiotics Nausea Only    Consultations:  Neurology   Procedures/Studies: Mr Virgel [place] Contrast  Result Date: 03/17/2019 CLINICAL DATA:  Stroke, follow-up. EXAM: MRI HEAD WITHOUT CONTRAST MRA HEAD WITHOUT CONTRAST TECHNIQUE: Multiplanar, multiecho pulse sequences of the brain and surrounding structures were obtained without intravenous contrast. Angiographic images of the head were obtained using MRA technique without contrast. COMPARISON:  None. FINDINGS: MRI HEAD FINDINGS Brain: By request of the neurologist, only diffusion-weighted  images were performed. Acute nonhemorrhagic infarct is present in the anterior left frontal lobe measuring up to 3 cm. A punctate infarct is present in the right parietal lobe cortex on image 88 of series 5. Bilateral linear cerebellar infarcts are present. Moderate periventricular and subcortical white matter changes are present bilaterally. Matter changes extend into the corona radiata  bilaterally. Diffuse white matter changes are also present in the right and stem Vascular: Unable to assess on diffusion weighted imaging. Skull and upper cervical spine: Skull base is within normal limits on diffusion-weighted imaging. Sinuses/Orbits: No definite sinus disease. Orbits cannot be assessed on diffusion only imaging. Other: MRA HEAD FINDINGS The internal carotid arteries are within normal limits from the high cervical segments through the ICA termini. The A1 and M1 segments are normal. The anterior communicating artery is patent. ACA and MCA branch vessels are within normal limits. The vertebral arteries are codominant. Left PICA origin is visualized and normal. The right AICA is dominant. The basilar artery is normal. The left posterior cerebral artery is fed by similar-sized left posterior communicating arteries and a P1 segment. A fetal type right posterior cerebral artery is present. A small right P1 segment is noted. PCA branch vessels are within normal limits bilaterally. IMPRESSION: 1. Acute nonhemorrhagic infarct in the anterior left frontal lobe. 2. Punctate cortical acute nonhemorrhagic infarct in the right parietal lobe. 3. Linear acute nonhemorrhagic infarcts involve the cerebellum bilaterally. 4. Infarcts of multiple territories suggest a central embolic source. 5. Diffuse white matter disease suggests chronic microvascular ischemia. 6. MRA circle-of-Willis demonstrates no significant proximal stenosis or occlusion. Distal disease is not evident on this study. Electronically Signed   By: San Morelle M.D.   On: 03/17/2019 16:42   Mr Brain Wo Contrast  Result Date: 03/17/2019 CLINICAL DATA:  Stroke, follow-up. EXAM: MRI HEAD WITHOUT CONTRAST MRA HEAD WITHOUT CONTRAST TECHNIQUE: Multiplanar, multiecho pulse sequences of the brain and surrounding structures were obtained without intravenous contrast. Angiographic images of the head were obtained using MRA technique without  contrast. COMPARISON:  None. FINDINGS: MRI HEAD FINDINGS Brain: By request of the neurologist, only diffusion-weighted images were performed. Acute nonhemorrhagic infarct is present in the anterior left frontal lobe measuring up to 3 cm. A punctate infarct is present in the right parietal lobe cortex on image 88 of series 5. Bilateral linear cerebellar infarcts are present. Moderate periventricular and subcortical white matter changes are present bilaterally. Matter changes extend into the corona radiata bilaterally. Diffuse white matter changes are also present in the right and stem Vascular: Unable to assess on diffusion weighted imaging. Skull and upper cervical spine: Skull base is within normal limits on diffusion-weighted imaging. Sinuses/Orbits: No definite sinus disease. Orbits cannot be assessed on diffusion only imaging. Other: MRA HEAD FINDINGS The internal carotid arteries are within normal limits from the high cervical segments through the ICA termini. The A1 and M1 segments are normal. The anterior communicating artery is patent. ACA and MCA branch vessels are within normal limits. The vertebral arteries are codominant. Left PICA origin is visualized and normal. The right AICA is dominant. The basilar artery is normal. The left posterior cerebral artery is fed by similar-sized left posterior communicating arteries and a P1 segment. A fetal type right posterior cerebral artery is present. A small right P1 segment is noted. PCA branch vessels are within normal limits bilaterally. IMPRESSION: 1. Acute nonhemorrhagic infarct in the anterior left frontal lobe. 2. Punctate cortical acute nonhemorrhagic infarct in the right parietal lobe. 3.  Linear acute nonhemorrhagic infarcts involve the cerebellum bilaterally. 4. Infarcts of multiple territories suggest a central embolic source. 5. Diffuse white matter disease suggests chronic microvascular ischemia. 6. MRA circle-of-Willis demonstrates no significant  proximal stenosis or occlusion. Distal disease is not evident on this study. Electronically Signed   By: San Morelle M.D.   On: 03/17/2019 16:42   Vas US Carotid (at [place] Only)  Result Date: 03/16/2019 Carotid Arterial Duplex Study Indications:       CVA. Risk Factors:      Hypertension. Comparison Study:  no prior study Performing Technologist: Abram Sander RVS  Examination Guidelines: A complete evaluation includes B-mode imaging, spectral Doppler, color Doppler, and power Doppler as needed of all accessible portions of each vessel. Bilateral testing is considered an integral part of a complete examination. Limited examinations for reoccurring indications may be performed as noted.  Right Carotid Findings: +----------+--------+--------+--------+-----------+--------+           PSV cm/sEDV cm/sStenosisDescribe   Comments +----------+--------+--------+--------+-----------+--------+ CCA Prox  60      10              homogeneous         +----------+--------+--------+--------+-----------+--------+ CCA Distal73      17              homogeneous         +----------+--------+--------+--------+-----------+--------+ ICA Prox  63      11      1-39%   homogeneous         +----------+--------+--------+--------+-----------+--------+ ICA Distal75      25                                  +----------+--------+--------+--------+-----------+--------+ ECA       103                                         +----------+--------+--------+--------+-----------+--------+ +----------+--------+-------+--------+-------------------+           PSV cm/sEDV cmsDescribeArm Pressure (mmHG) +----------+--------+-------+--------+-------------------+ [id number]                                        +----------+--------+-------+--------+-------------------+ +---------+--------+--+--------+--+---------+ VertebralPSV cm/s52EDV cm/s11Antegrade  +---------+--------+--+--------+--+---------+  Left Carotid Findings: +----------+--------+--------+--------+-----------+---------------------------+           PSV cm/sEDV cm/sStenosisDescribe   Comments                    +----------+--------+--------+--------+-----------+---------------------------+ CCA Prox  93      23              homogeneous                            +----------+--------+--------+--------+-----------+---------------------------+ CCA Distal106     23              homogeneous                            +----------+--------+--------+--------+-----------+---------------------------+ ICA Prox                  1-39%   homogeneousvelocity image not captured +----------+--------+--------+--------+-----------+---------------------------+ ICA Distal75      21                                                     +----------+--------+--------+--------+-----------+---------------------------+  ECA       75                                                             +----------+--------+--------+--------+-----------+---------------------------+ +----------+--------+--------+--------+-------------------+ SubclavianPSV cm/sEDV cm/sDescribeArm Pressure (mmHG) +----------+--------+--------+--------+-------------------+           82                                          +----------+--------+--------+--------+-------------------+ +---------+--------+--+--------+--+ VertebralPSV cm/s55EDV cm/s10 +---------+--------+--+--------+--+  Summary: Right Carotid: Velocities in the right ICA are consistent with a 1-39% stenosis. Left Carotid: Velocities in the left ICA are consistent with a 1-39% stenosis. Vertebrals: Bilateral vertebral arteries demonstrate antegrade flow. *See table(s) above for measurements and observations.  Electronically signed by Antony Contras MD on 03/16/2019 at 10:45:34 AM.    Final    Vas [place] Lower Extremity Venous (dvt)  Result Date:  03/18/2019  Lower Venous Study Indications: Stroke.  Limitations: Body habitus and skin texture. Comparison Study: No prior study on file for comparison Performing Technologist: Sharion Dove RVS  Examination Guidelines: A complete evaluation includes B-mode imaging, spectral Doppler, color Doppler, and power Doppler as needed of all accessible portions of each vessel. Bilateral testing is considered an integral part of a complete examination. Limited examinations for reoccurring indications may be performed as noted.  +---------+---------------+---------+-----------+----------+--------------+ RIGHT    CompressibilityPhasicitySpontaneityPropertiesSummary        +---------+---------------+---------+-----------+----------+--------------+ CFV      Full           Yes      Yes                                 +---------+---------------+---------+-----------+----------+--------------+ SFJ      Full                                                        +---------+---------------+---------+-----------+----------+--------------+ FV Prox  Full                                                        +---------+---------------+---------+-----------+----------+--------------+ FV Mid   Full                                                        +---------+---------------+---------+-----------+----------+--------------+ FV DistalFull                                                        +---------+---------------+---------+-----------+----------+--------------+ PFV      Full                                                        +---------+---------------+---------+-----------+----------+--------------+  POP      Full           Yes      Yes                                 +---------+---------------+---------+-----------+----------+--------------+ PTV                                                   Not visualized  +---------+---------------+---------+-----------+----------+--------------+ PERO                                                  Not visualized +---------+---------------+---------+-----------+----------+--------------+   +---------+---------------+---------+-----------+----------+--------------+ LEFT     CompressibilityPhasicitySpontaneityPropertiesSummary        +---------+---------------+---------+-----------+----------+--------------+ CFV      Full           Yes      Yes                                 +---------+---------------+---------+-----------+----------+--------------+ SFJ      Full                                                        +---------+---------------+---------+-----------+----------+--------------+ FV Prox  Full                                                        +---------+---------------+---------+-----------+----------+--------------+ FV Mid   Full                                                        +---------+---------------+---------+-----------+----------+--------------+ FV DistalFull                                                        +---------+---------------+---------+-----------+----------+--------------+ PFV      Full                                                        +---------+---------------+---------+-----------+----------+--------------+ POP      Full           Yes      Yes                                 +---------+---------------+---------+-----------+----------+--------------+  PTV                                                   Not visualized +---------+---------------+---------+-----------+----------+--------------+ PERO                                                  Not visualized +---------+---------------+---------+-----------+----------+--------------+     Summary: Right: There is no evidence of deep vein thrombosis in the lower extremity. However, portions of this  examination were limited- see technologist comments above. Left: There is no evidence of deep vein thrombosis in the lower extremity. However, portions of this examination were limited- see technologist comments above.  *See table(s) above for measurements and observations. Electronically signed by Harold Barban MD on 03/18/2019 at 4:36:22 PM.    Final    Vas [place] Transcranial Doppler  Result Date: 03/16/2019  Transcranial Doppler Indications: Stroke. Limitations for diagnostic windows: Unable to insonate right transtemporal window. Unable to insonate left transtemporal window. Performing Technologist: Abram Sander RVS  Examination Guidelines: A complete evaluation includes B-mode imaging, spectral Doppler, color Doppler, and power Doppler as needed of all accessible portions of each vessel. Bilateral testing is considered an integral part of a complete examination. Limited examinations for reoccurring indications may be performed as noted.  +----------+-------------+----------+-----------+-------+ RIGHT TCD Right VM (cm)Depth (cm)PulsatilityComment +----------+-------------+----------+-----------+-------+ Opthalmic     24.00                 1.71            +----------+-------------+----------+-----------+-------+ ICA siphon    43.00                 1.47            +----------+-------------+----------+-----------+-------+  +----------+------------+----------+-----------+-------+ LEFT TCD  Left VM (cm)Depth (cm)PulsatilityComment +----------+------------+----------+-----------+-------+ Opthalmic    23.00                 1.79            +----------+------------+----------+-----------+-------+ ICA siphon   44.00                                 +----------+------------+----------+-----------+-------+  Summary:  Absent bitemporal and occipital windows greatly limit exam.Normal blood flow directionsand velocities in both opthalmics and carotid siphons *See table(s) above for  measurements and observations.  Diagnosing physician: Antony Contras MD Electronically signed by Antony Contras MD on 03/16/2019 at 10:47:25 AM.    Final       Subjective: Patient seen and examined at the beside. Remains comfortable.No complains.  Discharge Exam: Vitals:   03/19/19 0755 03/19/19 1202  BP: (!) 99/52 (!) 96/49  Pulse: (!) 51 (!) 53  Resp: 20 16  Temp: 98.9 F (37.2 C) 98.6 F (37 C)  SpO2: 100% 100%   Vitals:   03/18/19 2321 03/19/19 0332 03/19/19 0755 03/19/19 1202  BP: 114/60 113/66 (!) 99/52 (!) 96/49  Pulse: (!) 51 (!) 59 (!) 51 (!) 53  Resp: 18 18 20 16   Temp: 98.3 F (36.8 C) 98 F (36.7 C) 98.9 F (37.2 C) 98.6 F (37 C)  TempSrc: Oral Oral Oral Oral  SpO2: 100% 100%  100% 100%  Weight:      Height:        General: Pt is alert, awake, not in acute distress Cardiovascular: RRR, S1/S2 +, no rubs, no gallops Respiratory: CTA bilaterally, no wheezing, no rhonchi Abdominal: Soft, NT, ND, bowel sounds + Extremities: no edema, no cyanosis    The results of significant diagnostics from this hospitalization (including imaging, microbiology, ancillary and laboratory) are listed below for reference.     Microbiology: Recent Results (from the past 240 hour(s))  SARS Coronavirus 2     Status: None   Collection Time: 03/15/19 10:04 PM  Result Value Ref Range Status   SARS Coronavirus 2 NOT DETECTED NOT DETECTED Final    Comment: (NOTE) SARS-CoV-2 target nucleic acids are NOT DETECTED. The SARS-CoV-2 RNA is generally detectable in upper and lower respiratory specimens during the acute phase of infection.  Negative  results do not preclude SARS-CoV-2 infection, do not rule out co-infections with other pathogens, and should not be used as the sole basis for treatment or other patient management decisions.  Negative results must be combined with clinical observations, patient history, and epidemiological information. The expected result is Not  Detected. Fact Sheet for Patients: http://www.biofiredefense.com/wp-content/uploads/2020/03/BIOFIRE-COVID -19-patients.pdf Fact Sheet for Healthcare Providers: http://www.biofiredefense.com/wp-content/uploads/2020/03/BIOFIRE-COVID -19-hcp.pdf This test is not yet approved or cleared by the [place] and  has been authorized for detection and/or diagnosis of SARS-CoV-2 by FDA under an Emergency Use Authorization (EUA).  This EUA will remain in effec t (meaning this test can be used) for the duration of  the COVID-19 declaration under Section 564(b)(1) of the Act, 21 U.S.C. section 360bbb-3(b)(1), unless the authorization is terminated or revoked sooner. Performed at [facility], [place] [address] [place], [place] [address]      Labs: BNP (last 3 results) No results for input(s): BNP in the last 8760 hours. Basic Metabolic Panel: Recent Labs  Lab 03/15/19 2127 03/15/19 2140  NA 140 138  K 4.0 4.2  CL 104 103  CO2 25  --   GLUCOSE 152* 147*  BUN 42* 50*  CREATININE 2.35* 2.30*  CALCIUM 9.1  --    Liver Function Tests: Recent Labs  Lab 03/15/19 2127  AST 19  ALT 14  ALKPHOS 86  BILITOT 0.4  PROT 6.0*  ALBUMIN 2.5*   No results for input(s): LIPASE, AMYLASE in the last 168 hours. No results for input(s): AMMONIA in the last 168 hours. CBC: Recent Labs  Lab 03/15/19 2127 03/15/19 2140  WBC 6.6  --   NEUTROABS 4.2  --   HGB 8.8* 9.2*  HCT 28.9* 27.0*  MCV 105.9*  --   PLT 231  --    Cardiac Enzymes: No results for input(s): CKTOTAL, CKMB, CKMBINDEX, TROPONINI in the last 168 hours. BNP: Invalid input(s): POCBNP CBG: Recent Labs  Lab 03/15/19 2158  GLUCAP 136*   D-Dimer No results for input(s): DDIMER in the last 72 hours. Hgb A1c No results for input(s): HGBA1C in the last 72 hours. Lipid Profile No results for input(s): CHOL, HDL, LDLCALC, TRIG, CHOLHDL, LDLDIRECT in the last 72 hours. Thyroid function studies No results for  input(s): TSH, T4TOTAL, T3FREE, THYROIDAB in the last 72 hours.  Invalid input(s): FREET3 Anemia work up No results for input(s): VITAMINB12, FOLATE, FERRITIN, TIBC, IRON, RETICCTPCT in the last 72 hours. Urinalysis    Component Value Date/Time   COLORURINE YELLOW 06/05/2015 0715   APPEARANCEUR CLOUDY (A) 06/05/2015 0715   LABSPEC 1.012 06/05/2015 0715  PHURINE 6.5 06/05/2015 0715   GLUCOSEU NEGATIVE 06/05/2015 0715   HGBUR LARGE (A) 06/05/2015 0715   BILIRUBINUR NEGATIVE 06/05/2015 0715   KETONESUR NEGATIVE 06/05/2015 0715   PROTEINUR 100 (A) 06/05/2015 0715   UROBILINOGEN 0.2 06/05/2015 0715   NITRITE POSITIVE (A) 06/05/2015 0715   LEUKOCYTESUR LARGE (A) 06/05/2015 0715   Sepsis Labs Invalid input(s): PROCALCITONIN,  WBC,  LACTICIDVEN Microbiology Recent Results (from the past 240 hour(s))  SARS Coronavirus 2     Status: None   Collection Time: 03/15/19 10:04 PM  Result Value Ref Range Status   SARS Coronavirus 2 NOT DETECTED NOT DETECTED Final    Comment: (NOTE) SARS-CoV-2 target nucleic acids are NOT DETECTED. The SARS-CoV-2 RNA is generally detectable in upper and lower respiratory specimens during the acute phase of infection.  Negative  results do not preclude SARS-CoV-2 infection, do not rule out co-infections with other pathogens, and should not be used as the sole basis for treatment or other patient management decisions.  Negative results must be combined with clinical observations, patient history, and epidemiological information. The expected result is Not Detected. Fact Sheet for Patients: http://www.biofiredefense.com/wp-content/uploads/2020/03/BIOFIRE-COVID -19-patients.pdf Fact Sheet for Healthcare Providers: http://www.biofiredefense.com/wp-content/uploads/2020/03/BIOFIRE-COVID -19-hcp.pdf This test is not yet approved or cleared by the [place] and  has been authorized for detection and/or diagnosis of SARS-CoV-2 by FDA under an Emergency  Use Authorization (EUA).  This EUA will remain in effec t (meaning this test can be used) for the duration of  the COVID-19 declaration under Section 564(b)(1) of the Act, 21 U.S.C. section 360bbb-3(b)(1), unless the authorization is terminated or revoked sooner. Performed at [facility], [place] [address] [place], [place] [address]     Please note: You were cared for by a hospitalist during your hospital stay. Once you are discharged, your primary care physician will handle any further medical issues. Please note that NO REFILLS for any discharge medications will be authorized once you are discharged, as it is imperative that you return to your primary care physician (or establish a relationship with a primary care physician if you do not have one) for your post hospital discharge needs so that they can reassess your need for medications and monitor your lab values.    Time coordinating discharge: 40 minutes  SIGNED:   Shelly Coss, MD  [facility] 03/19/2019, 1:31 PM Pager [phone]  If 7PM-7AM, please contact night-coverage [email or web address] Password [email or web address]

## 2019-03-19 NOTE — Progress Notes (Signed)
STROKE TEAM PROGRESS NOTE   INTERVAL HISTORY Pt lying in bed, no complains. Eager to go home. EP on board and found pt had afib on tele. Will need anticoagulation for stroke prevention.   Vitals:   03/18/19 2321 03/19/19 0332 03/19/19 0755 03/19/19 1202  BP: 114/60 113/66 (!) 99/52 (!) 96/49  Pulse: (!) 51 (!) 59 (!) 51 (!) 53  Resp: 18 18 20 16   Temp: 98.3 F (36.8 C) 98 F (36.7 C) 98.9 F (37.2 C) 98.6 F (37 C)  TempSrc: Oral Oral Oral Oral  SpO2: 100% 100% 100% 100%  Weight:      Height:        CBC:  Recent Labs  Lab 03/15/19 2127 03/15/19 2140  WBC 6.6  --   NEUTROABS 4.2  --   HGB 8.8* 9.2*  HCT 28.9* 27.0*  MCV 105.9*  --   PLT 231  --     Basic Metabolic Panel:  Recent Labs  Lab 03/15/19 2127 03/15/19 2140  NA 140 138  K 4.0 4.2  CL 104 103  CO2 25  --   GLUCOSE 152* 147*  BUN 42* 50*  CREATININE 2.35* 2.30*  CALCIUM 9.1  --    Lipid Panel:     Component Value Date/Time   CHOL 114 03/16/2019 0528   TRIG 61 03/16/2019 0528   HDL 57 03/16/2019 0528   CHOLHDL 2.0 03/16/2019 0528   VLDL 12 03/16/2019 0528   LDLCALC 45 03/16/2019 0528   HgbA1c:  Lab Results  Component Value Date   HGBA1C 5.3 03/16/2019   Urine Drug Screen: No results found for: LABOPIA, COCAINSCRNUR, LABBENZ, AMPHETMU, THCU, LABBARB  Alcohol Level No results found for: [facility]  IMAGING Mr Jodene Nam Head Wo Contrast  Result Date: 03/17/2019 CLINICAL DATA:  Stroke, follow-up. EXAM: MRI HEAD WITHOUT CONTRAST MRA HEAD WITHOUT CONTRAST TECHNIQUE: Multiplanar, multiecho pulse sequences of the brain and surrounding structures were obtained without intravenous contrast. Angiographic images of the head were obtained using MRA technique without contrast. COMPARISON:  None. FINDINGS: MRI HEAD FINDINGS Brain: By request of the neurologist, only diffusion-weighted images were performed. Acute nonhemorrhagic infarct is present in the anterior left frontal lobe measuring up to 3 cm. A punctate infarct  is present in the right parietal lobe cortex on image 88 of series 5. Bilateral linear cerebellar infarcts are present. Moderate periventricular and subcortical white matter changes are present bilaterally. Matter changes extend into the corona radiata bilaterally. Diffuse white matter changes are also present in the right and stem Vascular: Unable to assess on diffusion weighted imaging. Skull and upper cervical spine: Skull base is within normal limits on diffusion-weighted imaging. Sinuses/Orbits: No definite sinus disease. Orbits cannot be assessed on diffusion only imaging. Other: MRA HEAD FINDINGS The internal carotid arteries are within normal limits from the high cervical segments through the ICA termini. The A1 and M1 segments are normal. The anterior communicating artery is patent. ACA and MCA branch vessels are within normal limits. The vertebral arteries are codominant. Left PICA origin is visualized and normal. The right AICA is dominant. The basilar artery is normal. The left posterior cerebral artery is fed by similar-sized left posterior communicating arteries and a P1 segment. A fetal type right posterior cerebral artery is present. A small right P1 segment is noted. PCA branch vessels are within normal limits bilaterally. IMPRESSION: 1. Acute nonhemorrhagic infarct in the anterior left frontal lobe. 2. Punctate cortical acute nonhemorrhagic infarct in the right parietal lobe. 3. Linear  acute nonhemorrhagic infarcts involve the cerebellum bilaterally. 4. Infarcts of multiple territories suggest a central embolic source. 5. Diffuse white matter disease suggests chronic microvascular ischemia. 6. MRA circle-of-Willis demonstrates no significant proximal stenosis or occlusion. Distal disease is not evident on this study. Electronically Signed   By: San Morelle M.D.   On: 03/17/2019 16:42   Mr Brain Wo Contrast  Result Date: 03/17/2019 CLINICAL DATA:  Stroke, follow-up. EXAM: MRI HEAD  WITHOUT CONTRAST MRA HEAD WITHOUT CONTRAST TECHNIQUE: Multiplanar, multiecho pulse sequences of the brain and surrounding structures were obtained without intravenous contrast. Angiographic images of the head were obtained using MRA technique without contrast. COMPARISON:  None. FINDINGS: MRI HEAD FINDINGS Brain: By request of the neurologist, only diffusion-weighted images were performed. Acute nonhemorrhagic infarct is present in the anterior left frontal lobe measuring up to 3 cm. A punctate infarct is present in the right parietal lobe cortex on image 88 of series 5. Bilateral linear cerebellar infarcts are present. Moderate periventricular and subcortical white matter changes are present bilaterally. Matter changes extend into the corona radiata bilaterally. Diffuse white matter changes are also present in the right and stem Vascular: Unable to assess on diffusion weighted imaging. Skull and upper cervical spine: Skull base is within normal limits on diffusion-weighted imaging. Sinuses/Orbits: No definite sinus disease. Orbits cannot be assessed on diffusion only imaging. Other: MRA HEAD FINDINGS The internal carotid arteries are within normal limits from the high cervical segments through the ICA termini. The A1 and M1 segments are normal. The anterior communicating artery is patent. ACA and MCA branch vessels are within normal limits. The vertebral arteries are codominant. Left PICA origin is visualized and normal. The right AICA is dominant. The basilar artery is normal. The left posterior cerebral artery is fed by similar-sized left posterior communicating arteries and a P1 segment. A fetal type right posterior cerebral artery is present. A small right P1 segment is noted. PCA branch vessels are within normal limits bilaterally. IMPRESSION: 1. Acute nonhemorrhagic infarct in the anterior left frontal lobe. 2. Punctate cortical acute nonhemorrhagic infarct in the right parietal lobe. 3. Linear acute  nonhemorrhagic infarcts involve the cerebellum bilaterally. 4. Infarcts of multiple territories suggest a central embolic source. 5. Diffuse white matter disease suggests chronic microvascular ischemia. 6. MRA circle-of-Willis demonstrates no significant proximal stenosis or occlusion. Distal disease is not evident on this study. Electronically Signed   By: San Morelle M.D.   On: 03/17/2019 16:42    PHYSICAL EXAM Pleasant elderly Caucasian lady who is hard of hearing.  Not in distress. . Afebrile. Head is nontraumatic. Neck is supple without bruit.    Cardiac exam no murmur or gallop. Lungs are clear to auscultation. Distal pulses are well felt. Neurological Exam ;  Awake  Alert oriented x 3.  Slightly nonfluent hesitant speech and delay in her responses.  Mild difficulty with naming and repetition.  Able to name only 9 animals which walk on 4 legs.  Poor recall 0/3.  Eye movements full without nystagmus but mild saccadic dysmetria on right gaze.fundi were not visualized. Vision acuity and fields appear normal. Hearing is normal. Palatal movements are normal. Face symmetric. Tongue midline. Normal strength, tone, reflexes and coordination. Normal sensation. Gait deferred.  ASSESSMENT/PLAN Ms. Jillian Guerra is a 78 y.o. female with history of uterine cancer, HTN, HLD, DVT presenting with difficulty with word finding for a few days.   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   LE venous doppler no DVT   TEE not needed at this time  EP reviewed tele and confirms AF  LDL 45  HgbA1c 5.3  Heparin 5000 units sq tid for VTE prophylaxis  aspirin 81 mg daily prior to admission, now on aspirin 81 mg and plavix 75 mg daily. Given new AF dx,  will start eliquis 5mg  bid. Antiplatelet can be discontinued.  Therapy recommendations:  HH PT   Disposition:  Return home [place] for d/c from stroke standpoint Follow-up Stroke Clinic at [facility] in 4 weeks. Office will call with appointment date and time. Order placed.  Atrial Fibrillation, new dx  Confirmed by EP  [id number] Score = at least 6, ?2 oral anticoagulation recommended   Age in Years:  ?41   +2    Sex:  Female   Female   +1    Hypertension History:  yes   +1     Diabetes Mellitus:  0  Congestive Heart Failure History:  0  Vascular Disease History:  0    Stroke/TIA/Thromboembolism History:  yes   +2 . Treat with Eliquis (apixaban) at discharge   Hypertension  Stable - remains on the low side . 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 Please call with questions. Pt will follow up with stroke clinic NP at [facility] in about 4 weeks. Thanks for the consult.   Rosalin Hawking, MD PhD Stroke Neurology 03/19/2019 12:31 PM   To contact Stroke Continuity provider, please refer to [email or web address] After hours, contact General Neurology

## 2019-03-19 NOTE — TOC Progression Note (Signed)
Transition of Care [facility]) - Progression Note    Patient Details  Name: Jillian Guerra MRN: [id number] Date of Birth: [birth year]  Transition of [facility]) CM/SW Contact  Pollie Friar, RN Phone Number: 03/19/2019, 11:55 AM  Clinical Narrative:    [name] with [facility] had accepted pt for [facility] on Friday. Will update her once know d/c date. TOC following.   Expected Discharge Plan: [place] Barriers to Discharge: Continued Medical Work up  Expected Discharge Plan and Services Expected Discharge Plan: [place]   Discharge Planning Services: CM Consult Post Acute Care Choice: [place] arrangements for the past 2 months: Single Family Home                           HH Arranged: PT, OT, Speech Therapy [place]: [place] ([place]) Date [place]: 03/16/19   Representative spoke with at [place]: [place] ([place]) Interventions    Readmission Risk Interventions No flowsheet data found.

## 2019-04-23 ENCOUNTER — Other Ambulatory Visit: Payer: Self-pay

## 2019-04-23 ENCOUNTER — Encounter: Payer: Self-pay | Admitting: Adult Health

## 2019-04-23 ENCOUNTER — Ambulatory Visit (INDEPENDENT_AMBULATORY_CARE_PROVIDER_SITE_OTHER): Payer: Medicare Other | Admitting: Adult Health

## 2019-04-23 VITALS — BP 110/48 | HR 88 | Temp 99.3°F | Ht 63.0 in | Wt 202.8 lb

## 2019-04-23 DIAGNOSIS — I480 Paroxysmal atrial fibrillation: Secondary | ICD-10-CM | POA: Diagnosis not present

## 2019-04-23 DIAGNOSIS — I10 Essential (primary) hypertension: Secondary | ICD-10-CM | POA: Diagnosis not present

## 2019-04-23 DIAGNOSIS — I639 Cerebral infarction, unspecified: Secondary | ICD-10-CM | POA: Diagnosis not present

## 2019-04-23 DIAGNOSIS — E785 Hyperlipidemia, unspecified: Secondary | ICD-10-CM

## 2019-04-23 DIAGNOSIS — R4701 Aphasia: Secondary | ICD-10-CM

## 2019-04-23 NOTE — Progress Notes (Signed)
[facility] [address] [place]. [place] [address] [phone]       HOSPITAL FOLLOW UP NOTE  Ms. Jillian Guerra Date of Birth:  1941-04-20 Medical Record Number:  [id number]   Reason for Referral:  hospital stroke follow up    CHIEF COMPLAINT:  Chief Complaint  Patient presents with  . Hospitalization Follow-up    Stroke f/u. Daughter present. Treatment room. No concerns at this time.     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 History obtained from patient, daughter and chart review. Reviewed all radiology images and labs personally.  Jillian Guerra is a 78 y.o. female with history of uterine cancer, HTN, HLD, and DM who presented to [facility] ED on 03/15/2019 with difficulty with word finding for a few days.  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  MRI showed multiple infarcts within the left frontal lobe, right parietal and bilateral cerebellar infarcts.  Vessel imaging unremarkable.  2D echo showed an EF of 45 to 50%.  TCD absent by temporal and occipital windows.  Lower extremity venous Doppler negative for DVT.  PAF confirmed on telemetry monitor.  Initiated Eliquis 5 mg twice daily and discontinued aspirin.  HTN stable.  Initiated atorvastatin 20 mg daily for HTN management and discontinued simvastatin.  Other stroke risk factors include advanced age, obesity and family history of stroke.  Discharged home in stable condition with recommendations of home health PT.   Residual deficits of mild expressive aphasia and mild dysarthria but overall improving.  She was anticipating in home PT/OT/ST but has since been completed.  She ambulates with RW without recent falls.  Lives with daughter but is able to maintain ADLs independently.   Continues Eliquis without recent bleeding or bruising - was having issues initially with gross hematuria therefore PCP decreased to 5 mg daily and daughter is slowly increasing back to BID dosing. Has not had any more additional issues with hematuria. Has chronic foley catheter due to retaining of urine. Follows with urology regularly.  Continues atorvastatin without myalgias Blood pressure 110/48 -daughter plans on obtaining BP cuff at home She has not followed up with cardiology at this time -per daughter, PCP placed referral for cardiology but as PCP per of [facility], cardiologist will likely be located at [facility].  Daughter requesting referral within [facility] health system so they are able to see a provider within [place]. Denies new or worsening stroke/TIA     ROS:   14 system review of systems performed and negative with exception of speech difficulty  PMH:  Past Medical History:  Diagnosis Date  . Anemia   . Arthritis   . Deep vein blood clot of left lower extremity ([place])   . GERD (gastroesophageal reflux disease)   . Hypercholesteremia   . Hypertension   . Leukocytopenia    transient  . 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 Radiation 1998   hx of  . Uterine cancer (HCC)     PSH:  Past Surgical History:  Procedure Laterality Date  . APPENDECTOMY    . CHOLECYSTECTOMY    . CYSTOSCOPY/RETROGRADE/URETEROSCOPY Bilateral 05/08/2013   Procedure: CYSTOSCOPY/BLADDER BIOPSY/BILATERAL RETROGRADE PYELOGRAM;  Surgeon: Fredricka Bonine, MD;  Location: [facility]S;  Service: Urology;  Laterality: Bilateral;  left ureteroscpoy  . STAPEDECTOMY Bilateral [id number]  .  TOTAL ABDOMINAL HYSTERECTOMY  1998    Social History:  Social History   Socioeconomic History  . Marital status: Divorced    Spouse name: Not on file  . Number of children: Not on file  . Years of education: Not on file  . Highest education level: Not on file  Occupational History  . Occupation: retired [employer]   Social Needs  . Financial resource strain: Not on file  . Food insecurity    Worry: Not on file    Inability: Not on file  . Transportation needs    Medical: Not on file    Non-medical: Not on file  Tobacco Use  . Smoking status: Never Smoker  . Smokeless tobacco: Never Used  Substance and Sexual Activity  . Alcohol use: No  . Drug use: No  . Sexual activity: Not on file  Lifestyle  . Physical activity    Days per week: Not on file    Minutes per session: Not on file  . Stress: Not on file  Relationships  . Social Herbalist on phone: Not on file    Gets together: Not on file    Attends religious service: Not on file    Active member of club or organization: Not on file    Attends meetings of clubs or organizations: Not on file    Relationship status: Not on file  . Intimate partner violence    Fear of current or ex partner: Not on file    Emotionally abused: Not on file    Physically abused: Not on file    Forced sexual activity: Not on file  Other Topics Concern  . Not on file  Social History Narrative   Lives with daughter right now but from [place]    Family History:  Family History  Problem Relation Age of Onset  . Diabetes Sister   . Stroke Father     Medications:   Current Outpatient Medications on File Prior to Visit  Medication Sig Dispense Refill  . acetaminophen (TYLENOL) 500 MG tablet Take 1,000 mg by mouth every 6 (six) hours as needed for mild pain.     Marland Kitchen apixaban (ELIQUIS) 5 MG TABS tablet Take 1 tablet (5 mg total) by mouth 2 (two) times daily. 60 tablet 0  . atorvastatin (LIPITOR) 20 MG tablet Take 1 tablet (20 mg total) by mouth daily at 6 PM. 30 tablet 0  . Cholecalciferol 25 MCG (1000 UT) capsule Take 2,000 Units by mouth daily.    . folic acid (FOLVITE) 1 MG tablet Take 1 mg by mouth daily.    . furosemide (LASIX) 40 MG tablet Take 60 mg by mouth daily.     No current facility-administered medications on file prior to visit.      Allergies:   Allergies  Allergen Reactions  . Ciprofloxacin   . Other Nausea And Vomiting  . Sulfa Antibiotics Nausea Only     Physical Exam  Vitals:   04/23/19 1449  BP: (!) 110/48  Pulse: 88  Temp: 99.3 F (37.4 C)  TempSrc: Oral  Weight: 202 lb 12.8 oz (92 kg)  Height: 5\' 3"  (1.6 m)   Body mass index is 35.92 kg/m. No exam data present  No flowsheet data found.   General: well developed, well nourished,  pleasant elderly Caucasian female seated, in no evident distress Head: head normocephalic and atraumatic.   Neck: supple with no carotid or supraclavicular bruits Cardiovascular: irregular rate and  rhythm, no murmurs; bilateral lower extremity edema Musculoskeletal: no deformity Skin:  no rash/petichiae Vascular:  Normal pulses all extremities   Neurologic Exam Mental Status: Awake and fully alert.   Mild expressive aphasia and dysarthria.  Oriented to place and time. Recent and remote memory intact. Attention span, concentration and fund of knowledge appropriate. Mood and affect appropriate.  Cranial Nerves: Pupils equal, briskly reactive to light. Extraocular movements full without nystagmus. Visual fields full to confrontation. Hearing intact. Facial sensation intact. Face, tongue, palate moves normally and symmetrically.  Motor: Normal bulk and tone. Normal strength in all tested extremity muscles.  Difficulty assessing bilateral lower extremities due to difficulty raising legs due to pain and edema. Sensory.: intact to touch , pinprick , position and vibratory sensation.  Coordination: Rapid alternating movements normal in all extremities. Finger-to-nose performed accurately bilaterally and heel-to-shin unable to be performed due to lower extremity edema Gait and Station: Arises from chair with mild difficulty. Stance is normal. Gait demonstrates  broad-based gait with assistance of rolling walker Reflexes: 1+ and symmetric. Toes downgoing.     NIHSS  2  Modified Rankin  1 CHA2DS2-VASc 6 HAS-BLED 2   Diagnostic Data (Labs, Imaging, Testing)   MR BRAIN WO CONTRAST 03/17/2019 IMPRESSION: 1. Acute nonhemorrhagic infarct in the anterior left frontal lobe. 2. Punctate cortical acute nonhemorrhagic infarct in the right parietal lobe. 3. Linear acute nonhemorrhagic infarcts involve the cerebellum bilaterally. 4. Infarcts of multiple territories suggest a central embolic source. 5. Diffuse white matter disease suggests chronic microvascular ischemia. 6. MRA circle-of-Willis demonstrates no significant proximal stenosis or occlusion. Distal disease is not evident on this study.  MR MRA HEAD  03/17/2019 IMPRESSION: 1. Acute nonhemorrhagic infarct in the anterior left frontal lobe. 2. Punctate cortical acute nonhemorrhagic infarct in the right parietal lobe. 3. Linear acute nonhemorrhagic infarcts involve the cerebellum bilaterally. 4. Infarcts of multiple territories suggest a central embolic source. 5. Diffuse white matter disease suggests chronic microvascular ischemia. 6. MRA circle-of-Willis demonstrates no significant proximal stenosis or occlusion. Distal disease   ECHOCARDIOGRAM 03/16/2019 IMPRESSIONS  1. The left ventricle has mildly reduced systolic function, with an ejection fraction of 45-50%. The cavity size was normal. There is mildly increased left ventricular wall thickness. Left ventricular diastolic Doppler parameters are consistent with  impaired relaxation.  2. The right ventricle has normal systolic function. The cavity was mildly enlarged. There is no increase in right ventricular wall thickness. Right ventricular systolic pressure is moderately elevated with an estimated pressure of 50.6 mmHg.  3. No evidence of mitral valve stenosis.  4. Pulmonary hypertension is moderate.  5. The interatrial septum was not assessed.  VAS [place] TRANSCRANIAL DOPPLER 03/16/2019 Summary: Right Carotid: Velocities in the right  ICA are consistent with a 1-39% stenosis. Left Carotid: Velocities in the left ICA are consistent with a 1-39% stenosis. Vertebrals: Bilateral vertebral arteries demonstrate antegrade flow.  VAS [place] TRANSCRANIAL DOPPLER 03/16/2019 Summary: Absent bitemporal and occipital windows greatly limit exam.Normal blood flow directionsand velocities in both opthalmics and carotid siphons    ASSESSMENT: Jillian Guerra is a 78 y.o. 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 Vascular risk factors include new diagnosis of PAF, HTN, HLD and DM.  Residual deficits of mild expressive aphasia and mild dysarthria but overall recovering well.    PLAN:  1. Embolic infarcts: Continue Eliquis (apixaban) daily  and atorvastatin for secondary stroke prevention.  Maintain strict control of hypertension with blood pressure goal below 130/90, diabetes with hemoglobin A1c goal below 6.5% and cholesterol with LDL cholesterol (bad cholesterol) goal below 70 mg/dL.  I also advised the patient to eat a healthy diet with plenty of whole grains, cereals, fruits and vegetables, exercise regularly with at least 30 minutes of continuous activity daily and maintain ideal body weight. 2. Residual speech deficit: Advised to continue to do home exercises and to notify office if interested in participating in outpatient therapies as she is currently declining additional therapy at this time 3. PAF: Referral placed to [facility] cardiology as daughter requested Dr. Ancil Linsey 4. HTN: Advised to continue current treatment regimen.  Today's BP satisfactory.  Advised to continue to monitor at home along with continued follow-up with PCP for management 5. HLD: Advised to continue current treatment regimen along with continued follow-up with PCP for future prescribing and monitoring of lipid panel 6. DM: Ongoing  follow-up with PCP for management    Follow up in 3 months or call earlier if needed   Greater than 50% of time during this 45 minute visit was spent on counseling, explanation of diagnosis of embolic infarcts, reviewing risk factor management of PAF, HTN, HLD and DM, planning of further management along with potential future management, and discussion with patient and family answering all questions.    Venancio Poisson, AGNP-BC  [facility] [address] [place], [place] [address]  Phone [phone] Fax [phone] Note: This document was prepared with digital dictation and possible smart phrase technology. Any transcriptional errors that result from this process are unintentional.

## 2019-04-23 NOTE — Patient Instructions (Signed)
Continue Eliquis (apixaban) daily  and atorvastatin for secondary stroke prevention  Continue to follow up with PCP regarding cholesterol and blood pressure management   Referral placed to cardiology to establish care for atrial fibrillation management  Continue to monitor blood pressure at home  Maintain strict control of hypertension with blood pressure goal below 130/90, diabetes with hemoglobin A1c goal below 6.5% and cholesterol with LDL cholesterol (bad cholesterol) goal below 70 mg/dL. I also advised the patient to eat a healthy diet with plenty of whole grains, cereals, fruits and vegetables, exercise regularly and maintain ideal body weight.  Followup in the future with me in 3 months or call earlier if needed       Thank you for coming to see [place] at [facility]. I hope we have been able to provide you high quality care today.  You may receive a patient satisfaction survey over the next few weeks. We would appreciate your feedback and comments so that we may continue to improve ourselves and the health of our patients.

## 2019-04-24 NOTE — Progress Notes (Signed)
I agree with the above plan 

## 2019-05-11 ENCOUNTER — Telehealth: Payer: Self-pay | Admitting: Cardiovascular Disease

## 2019-05-11 NOTE — Telephone Encounter (Signed)
°  The phone number listed is for pt's daughter Bernita Raisin. Daughter will call to set up appt for the patient from the referral.

## 2019-05-27 NOTE — Progress Notes (Signed)
Cardiology Office Note:   Date:  05/28/2019  NAME:  Jillian Guerra    MRN: [id number] DOB:  04/27/1941   PCP:  Curly Rim, MD  Cardiologist:  Evalina Field, MD  Electrophysiologist:  None   Referring MD: Claris Gower, NP   Chief Complaint  Patient presents with   Atrial Fibrillation   History of Present Illness:   Jillian Guerra is a 78 y.o. 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 She presents today for further evaluation of her recent diagnosis of atrial fibrillation, that occurred in the setting of a recent stroke in July of this year.  Her daughter presents with her, who is a Designer, jewellery, who reports she has really no deficits from the stroke and has recovered nearly fully.  Ms. Rader resides with her daughter, and has done so for the past 9 years.  She carries a prior diagnosis of obstructive uropathy, for unclear reasons per her report, and has had issues with acute kidney injury recently due to difficulties with her Foley catheters.  She seems recovered from this, and her recent creatinine per their report is 1.7.  She reports no episodes of palpitations, chest pain, trouble breathing.  She does have rather impressive lower extremity edema, related to lymphadenopathy from her prior cancer, and this seems to have resulted in chronic venous stasis.  There is no lymphedema however it is quite apparent she has venous insufficiency.  Her most recent A1c is 5.3, LDL cholesterol 45.  She is tolerating Eliquis quite well, and reports no bleeding issues.  There was  concerns for hematuria, however she was cleared by her urologist to continue taking Eliquis.  There are no falls either reported in the house.  She is mainly staying around her house these days, and is able to dress herself, bathe herself, and do things around the house without limitations.  She does not exercise routinely, but does all activities/chores around the house without limitations.  Past Medical History: 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   Radiation 1998   hx of   Uterine cancer [facility])    Past Surgical History: Past Surgical History:  Procedure Laterality Date   APPENDECTOMY     CHOLECYSTECTOMY     CYSTOSCOPY/RETROGRADE/URETEROSCOPY Bilateral 05/08/2013   Procedure: CYSTOSCOPY/BLADDER BIOPSY/BILATERAL RETROGRADE PYELOGRAM;  Surgeon: Fredricka Bonine, MD;  Location: [facility];  Service: Urology;  Laterality: Bilateral;  left ureteroscpoy   STAPEDECTOMY Bilateral 2002,2013   TOTAL ABDOMINAL HYSTERECTOMY  1998    Current Medications: Current Meds  Medication Sig   acetaminophen (TYLENOL) 500 MG tablet Take 1,000 mg by mouth every 6 (six) hours as needed for mild pain.    apixaban (ELIQUIS) 5 MG TABS tablet Take 1 tablet (5 mg total) by mouth 2 (two) times daily.   atorvastatin (LIPITOR) 20 MG  tablet Take 1 tablet (20 mg total) by mouth daily at 6 PM.   Cholecalciferol 25 MCG (1000 UT) capsule Take 2,000 Units by mouth daily.   folic acid (FOLVITE) 1 MG tablet Take 1 mg by mouth daily.   furosemide (LASIX) 40 MG tablet Take 60 mg by mouth daily.     Allergies:    Ciprofloxacin, Other, and Sulfa antibiotics   Social History: Social History   Socioeconomic History   Marital status: Divorced    Spouse name: Not on file   Number  of children: Not on file   Years of education: Not on file   Highest education level: Not on file  Occupational History   Occupation: retired Financial trader strain: Not on file   Food insecurity    Worry: Not on file    Inability: Not on file   Transportation needs    Medical: Not on file    Non-medical: Not on file  Tobacco Use   Smoking status: Never Smoker   Smokeless tobacco: Never Used  Substance and Sexual Activity   Alcohol use: No   Drug use: No   Sexual activity: Not on file  Lifestyle   Physical activity    Days per week: Not on file    Minutes per session: Not on file   Stress: Not on file  Relationships   Social connections    Talks on phone: Not on file    Gets together: Not on file    Attends religious service: Not on file    Active member of club or organization: Not on file    Attends meetings of clubs or organizations: Not on file    Relationship status: Not on file  Other Topics Concern   Not on file  Social History Narrative   Lives with daughter right now but from [place]     Family History: The patient's family history includes Diabetes in her sister; Stroke in her father.  ROS:   All other ROS reviewed and negative. Pertinent positives noted in the HPI.     EKGs/Labs/Other Studies Reviewed:   The following studies were personally reviewed by me today: HDL 57, LDL 45, A1c 5.3, creatinine 1.7 (reported per daughter)  TTE 03/16/2019  1. The left ventricle has mildly reduced systolic function, with an ejection fraction of 45-50%. The cavity size was normal. There is mildly increased left ventricular wall thickness. Left ventricular diastolic Doppler parameters are consistent with  impaired relaxation.  2. The right ventricle has normal systolic function. The cavity was mildly enlarged. There is no increase in right ventricular wall thickness. Right ventricular systolic pressure is moderately elevated with  an estimated pressure of 50.6 mmHg.  3. No evidence of mitral valve stenosis.  4. Pulmonary hypertension is moderate.  5. The interatrial septum was not assessed.  EKG:  EKG is ordered today.  The ekg ordered today demonstrates normal sinus rhythm, frequent PACs noted, heart rate 85, normal intervals, no ST-T changes to suggest ischemia or prior infarction, and was personally reviewed by me.   Recent Labs: 03/15/2019: ALT 14; BUN 50; Creatinine, Ser 2.30; Hemoglobin 9.2; Platelets 231; Potassium 4.2; Sodium 138   Recent Lipid Panel    Component Value Date/Time   CHOL 114 03/16/2019 0528   TRIG 61 03/16/2019 0528   HDL 57 03/16/2019 0528   CHOLHDL 2.0 03/16/2019 0528   VLDL 12 03/16/2019 0528   LDLCALC 45 03/16/2019 0528  Physical Exam:   VS:  BP 124/72    Pulse 85    Ht 5\' 3"  (1.6 m)    Wt 201 lb 12.8 oz (91.5 kg)    SpO2 99%    BMI 35.75 kg/m    Wt Readings from Last 3 Encounters:  05/28/19 201 lb 12.8 oz (91.5 kg)  04/23/19 202 lb 12.8 oz (92 kg)  03/15/19 200 lb (90.7 kg)    General: Well nourished, well developed, in no acute distress Heart: Atraumatic, normal size  Eyes: PEERLA, EOMI  Neck: Supple, no JVD Endocrine: No thryomegaly Cardiac: Normal S1, S2; RRR; no murmurs, rubs, or gallops Lungs: Clear to auscultation bilaterally, no wheezing, rhonchi or rales  Abd: Soft, nontender, no hepatomegaly  Ext: 2+ lower extremity edema up to the knees, skin with venous insufficiency changes, no lymphedema Musculoskeletal: No deformities, BUE and BLE strength normal and equal Skin: Warm and dry, no rashes   Neuro: Alert and oriented to person, place, time, and situation, CNII-XII grossly intact, no focal deficits  Psych: Normal mood and affect   ASSESSMENT:   NAME@ is a 78 y.o. female who presents for the following: 1. Paroxysmal atrial fibrillation (HCC)   2. Essential hypertension   3. Mixed hyperlipidemia   4. PAC (premature atrial contraction)   5. Venous  insufficiency (chronic) (peripheral)     PLAN:   1. Paroxysmal atrial fibrillation (HCC) -Recent diagnosis of paroxysmal atrial fibrillation, complicated by thromboembolic stroke.  Chads-vasc = 4.  EKG today in normal sinus rhythm, with frequent PACs.  No further recurrence of atrial fibrillation that I can tell. -We will continue her Eliquis for stroke prevention and I have instructed her that it will likely be a lifelong therapy for her given her stroke.  If she runs into bleeding issues this can always be reassessed and I assured them that we would not want her to continue taking this medication if she was experiencing any bleeding -We will start metoprolol succinate 25 mg daily to suppress her PACs, and possibly this will reduce any further atrial fibrillation -No need for rhythm control at this time as she is intermittently in this, and likely without symptoms  2. Essential hypertension -Stable off medications  3. Mixed hyperlipidemia -LDL goal less than 70, due to prior stroke -Most recent LDL 45 on Lipitor, will continue this therapy -She is not on aspirin, and I will defer this to neurology  4. PAC (premature atrial contraction) -Beta-blocker as above  5. Venous insufficiency  -This is a chronic issue, and she will continue with Lasix as prescribed by her nephrologist, and her daughter monitors this frequently, who is a Designer, jewellery  Disposition: Return in about 4 months (around 09/27/2019).  Medication Adjustments/Labs and Tests Ordered: Current medicines are reviewed at length with the patient today.  Concerns regarding medicines are outlined above.  Orders Placed This Encounter  Procedures   EKG 12-Lead   Meds ordered this encounter  Medications   metoprolol succinate (TOPROL XL) 25 MG 24 hr tablet    Sig: Take 1 tablet (25 mg total) by mouth daily.    Dispense:  90 tablet    Refill:  3    Patient Instructions  Medication Instructions:   -START   METOPROLOL SUCCINATE ( TOPROL XL ) 25 MG ONR TABLET DAILY. CONTINUE ALL OTHER MEDICATION  If you need a refill on your cardiac medications before your next appointment, please call your pharmacy.   Lab work:  Not needed  Testing/Procedures: Not needed  Follow-Up: At [facility], you and your health needs are our priority.  As part of our continuing mission to provide you with exceptional heart care, we have created designated Provider Care Teams.  These Care Teams include your primary Cardiologist (physician) and Advanced Practice Providers (APPs -  Physician Assistants and Nurse Practitioners) who all work together to provide you with the care you need, when you need it.  Dr Audie Box recommends that you schedule a follow-up appointment in: 4 months - Dec 2020     Any Other Special Instructions Will Be Listed Below (If Applicable).    Signed, Addison Naegeli. Audie Box, [place]  [address], [place], [place] [address] [phone]  05/28/2019 11:11 AM

## 2019-05-28 ENCOUNTER — Ambulatory Visit (INDEPENDENT_AMBULATORY_CARE_PROVIDER_SITE_OTHER): Payer: Medicare Other | Admitting: Cardiovascular Disease

## 2019-05-28 ENCOUNTER — Other Ambulatory Visit: Payer: Self-pay

## 2019-05-28 ENCOUNTER — Encounter: Payer: Self-pay | Admitting: Cardiovascular Disease

## 2019-05-28 VITALS — BP 124/72 | HR 85 | Ht 63.0 in | Wt 201.8 lb

## 2019-05-28 DIAGNOSIS — E782 Mixed hyperlipidemia: Secondary | ICD-10-CM

## 2019-05-28 DIAGNOSIS — I872 Venous insufficiency (chronic) (peripheral): Secondary | ICD-10-CM

## 2019-05-28 DIAGNOSIS — I10 Essential (primary) hypertension: Secondary | ICD-10-CM

## 2019-05-28 DIAGNOSIS — I491 Atrial premature depolarization: Secondary | ICD-10-CM

## 2019-05-28 DIAGNOSIS — I480 Paroxysmal atrial fibrillation: Secondary | ICD-10-CM

## 2019-05-28 MED ORDER — METOPROLOL SUCCINATE ER 25 MG PO TB24: 25.0000 mg | ORAL_TABLET | Freq: Every day | ORAL | 3 refills | Status: DC

## 2019-05-28 NOTE — Patient Instructions (Addendum)
Medication Instructions:   -START  METOPROLOL SUCCINATE ( TOPROL XL ) 25 MG ONR TABLET DAILY. CONTINUE ALL OTHER MEDICATION  If you need a refill on your cardiac medications before your next appointment, please call your pharmacy.   Lab work:  Not needed  Testing/Procedures: Not needed  Follow-Up: At [facility], you and your health needs are our priority.  As part of our continuing mission to provide you with exceptional heart care, we have created designated Provider Care Teams.  These Care Teams include your primary Cardiologist (physician) and Advanced Practice Providers (APPs -  Physician Assistants and Nurse Practitioners) who all work together to provide you with the care you need, when you need it. . Dr Audie Box recommends that you schedule a follow-up appointment in: 4 months - Dec 2020     Any Other Special Instructions Will Be Listed Below (If Applicable).

## 2019-07-24 ENCOUNTER — Ambulatory Visit: Payer: Medicare Other | Admitting: Adult Health

## 2019-09-19 ENCOUNTER — Telehealth: Payer: Self-pay

## 2019-09-19 NOTE — Telephone Encounter (Signed)
Called pt to change appointment on 12/18 to virtual. Pt state she would prefer to be seen in office. Appointment rescheduled for 1/4.

## 2019-09-21 ENCOUNTER — Ambulatory Visit: Payer: Medicare Other | Admitting: Cardiovascular Disease

## 2019-10-05 NOTE — Progress Notes (Deleted)
Cardiology Office Note:   Date:  10/05/2019  NAME:  Jillian Guerra    MRN: [id number] DOB:  [birth year]   PCP:  Curly Rim, MD  Cardiologist:  Evalina Field, MD  Electrophysiologist:  None   Referring MD: Curly Rim, MD   No chief complaint on file. ***  History of Present Illness:   Jillian Guerra is a 79 y.o. female with a hx of paroxysmal Afib, CKD IV, stroke, endometrial CA s/p radiation who presents for follow-up of Atrial fib. Diagnosed with Afib in setting of stroke. Started on metoprolol in August. Tolerating eliquis well.    Problem List 1. Paroxysmal atrial fibrillation  -diagnosed 03/2019 in setting of stroke  2. Stroke  -cardioembolic 3. CKD IV FSGS/obstructive uropathy  4. Endometrial CA s/p radiation  5. Radiation colitis 6. Venous insufficiency  Past Medical History: Past Medical History:  Diagnosis Date  . Anemia   . Arrhythmia   . Arthritis   . Deep vein blood clot of left lower extremity ([place])   . GERD (gastroesophageal reflux disease)   . Hypercholesteremia   . Hypertension   . Leukocytopenia    transient  . 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 Radiation 1998   hx of  . Uterine cancer [facility])     Past Surgical History: Past Surgical History:  Procedure Laterality Date  . APPENDECTOMY    . CHOLECYSTECTOMY    . CYSTOSCOPY/RETROGRADE/URETEROSCOPY Bilateral 05/08/2013   Procedure: CYSTOSCOPY/BLADDER BIOPSY/BILATERAL RETROGRADE PYELOGRAM;  Surgeon: Fredricka Bonine, MD;  Location: [facility];  Service: Urology;  Laterality: Bilateral;  left ureteroscpoy  . STAPEDECTOMY Bilateral [id number]  . TOTAL ABDOMINAL HYSTERECTOMY  1998    Current Medications: No outpatient medications have been marked as taking for the 10/08/19 encounter (Appointment) with O'Neal, Cassie Freer, MD.     Allergies:    Ciprofloxacin, Other, and Sulfa antibiotics   Social History: Social History   Socioeconomic History  . Marital status: Divorced    Spouse name: Not on  file  . Number of children: Not on file  . Years of education: Not on file  . Highest education level: Not on file  Occupational History  . Occupation: retired Copy  Tobacco Use  . Smoking status: Never Smoker  . Smokeless tobacco: Never Used  Substance and Sexual Activity  . Alcohol use: No  . Drug use: No  . Sexual activity: Not on file  Other Topics Concern  . Not on file  Social History Narrative   Lives with daughter right now but from [place]   Social Determinants of Health   Financial Resource Strain:   . Difficulty of Paying Living Expenses: Not on file  Food Insecurity:   . Worried About Charity fundraiser in the Last Year: Not on file  . Ran Out of Food in the Last Year: Not on file  Transportation Needs:   . Lack of Transportation (Medical): Not on file  . Lack of Transportation (Non-Medical): Not on file  Physical Activity:   . Days of Exercise per Week: Not on file  . Minutes of Exercise per Session: Not on file  Stress:   . Feeling of Stress : Not on file  Social Connections:   . Frequency of Communication with Friends and Family: Not on file  . Frequency of Social Gatherings with Friends and Family: Not on file  . Attends Religious Services: Not on file  . Active Member of Clubs or Organizations: Not  on file  . Attends Archivist Meetings: Not on file  . Marital Status: Not on file     Family History: The patient's ***family history includes Diabetes in her sister; Stroke in her father.  ROS:   All other ROS reviewed and negative. Pertinent positives noted in the HPI.     EKGs/Labs/Other Studies Reviewed:   The following studies were personally reviewed by me today:  EKG:  EKG is *** ordered today.  The ekg ordered today demonstrates ***, and was personally reviewed by me.   Recent Labs: 03/15/2019: ALT 14; BUN 50; Creatinine, Ser 2.30; Hemoglobin 9.2; Platelets 231; Potassium 4.2; Sodium 138   Recent Lipid Panel    Component  Value Date/Time   CHOL 114 03/16/2019 0528   TRIG 61 03/16/2019 0528   HDL 57 03/16/2019 0528   CHOLHDL 2.0 03/16/2019 0528   VLDL 12 03/16/2019 0528   LDLCALC 45 03/16/2019 0528    Physical Exam:   VS:  There were no vitals taken for this visit.   Wt Readings from Last 3 Encounters:  05/28/19 201 lb 12.8 oz (91.5 kg)  04/23/19 202 lb 12.8 oz (92 kg)  03/15/19 200 lb (90.7 kg)    General: Well nourished, well developed, in no acute distress Heart: Atraumatic, normal size  Eyes: PEERLA, EOMI  Neck: Supple, no JVD Endocrine: No thryomegaly Cardiac: Normal S1, S2; RRR; no murmurs, rubs, or gallops Lungs: Clear to auscultation bilaterally, no wheezing, rhonchi or rales  Abd: Soft, nontender, no hepatomegaly  Ext: No edema, pulses 2+ Musculoskeletal: No deformities, BUE and BLE strength normal and equal Skin: Warm and dry, no rashes   Neuro: Alert and oriented to person, place, time, and situation, CNII-XII grossly intact, no focal deficits  Psych: Normal mood and affect   ASSESSMENT:   Jillian Guerra is a 79 y.o. female who presents for the following: No diagnosis found.  PLAN:   There are no diagnoses linked to this encounter.  Disposition: No follow-ups on file.  Medication Adjustments/Labs and Tests Ordered: Current medicines are reviewed at length with the patient today.  Concerns regarding medicines are outlined above.  No orders of the defined types were placed in this encounter.  No orders of the defined types were placed in this encounter.   There are no Patient Instructions on file for this visit.   Signed, Addison Naegeli. Audie Box, [place]  [address], [place], [place] [address] [phone]  10/05/2019 9:40 AM

## 2019-10-08 ENCOUNTER — Ambulatory Visit: Payer: Medicare Other | Admitting: Cardiovascular Disease

## 2019-10-10 ENCOUNTER — Ambulatory Visit: Payer: Medicare Other | Attending: Internal Medicine

## 2019-10-10 DIAGNOSIS — Z20822 Contact with and (suspected) exposure to covid-19: Secondary | ICD-10-CM

## 2019-10-11 LAB — NOVEL CORONAVIRUS, NAA: SARS-CoV-2, NAA: NOT DETECTED

## 2019-10-18 ENCOUNTER — Ambulatory Visit: Payer: Medicare Other | Attending: Internal Medicine

## 2019-10-18 DIAGNOSIS — Z20822 Contact with and (suspected) exposure to covid-19: Secondary | ICD-10-CM

## 2019-10-19 LAB — NOVEL CORONAVIRUS, NAA: SARS-CoV-2, NAA: NOT DETECTED

## 2019-10-24 ENCOUNTER — Ambulatory Visit: Payer: Medicare Other | Admitting: Adult Health

## 2019-10-24 ENCOUNTER — Encounter: Payer: Self-pay | Admitting: Adult Health

## 2019-10-24 ENCOUNTER — Other Ambulatory Visit: Payer: Self-pay

## 2019-10-24 VITALS — BP 121/65 | HR 65 | Temp 97.6°F | Ht 63.0 in | Wt 196.6 lb

## 2019-10-24 DIAGNOSIS — I10 Essential (primary) hypertension: Secondary | ICD-10-CM | POA: Diagnosis not present

## 2019-10-24 DIAGNOSIS — Z8673 Personal history of transient ischemic attack (TIA), and cerebral infarction without residual deficits: Secondary | ICD-10-CM | POA: Diagnosis not present

## 2019-10-24 DIAGNOSIS — I480 Paroxysmal atrial fibrillation: Secondary | ICD-10-CM

## 2019-10-24 DIAGNOSIS — E785 Hyperlipidemia, unspecified: Secondary | ICD-10-CM

## 2019-10-24 NOTE — Progress Notes (Signed)
I agree with the above plan 

## 2019-10-24 NOTE — Progress Notes (Signed)
[facility] [address] [place]. [place] [address] [phone]       STROKE FOLLOW UP NOTE  Ms. Jillian Guerra Date of Birth:  03-May-1941 Medical Record Number:  [id number]   Reason for Referral: stroke follow up    CHIEF COMPLAINT:  Chief Complaint  Patient presents with  . Follow-up    TxRM. with daughter. No concerns nor changes.    HPI: Stroke admission 03/15/2019: Jillian Guerra is a 79 y.o. female with history of uterine cancer, HTN, HLD, and DM who presented to [facility] ED on 03/15/2019 with difficulty with word finding for a few days.  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  MRI showed multiple infarcts within the left frontal lobe, right parietal and bilateral cerebellar infarcts.  Vessel imaging unremarkable.  2D echo showed an EF of 45 to 50%.  TCD absent by temporal and occipital windows.  Lower extremity venous Doppler negative for DVT.  PAF confirmed on telemetry monitor.  Initiated Eliquis 5 mg twice daily and discontinued aspirin.  HTN stable.  Initiated atorvastatin 20 mg daily for HTN management and discontinued simvastatin.  Other stroke risk factors include advanced age, obesity and family history of stroke.  Discharged home in stable condition with recommendations of home health PT.   Initial visit 04/23/2019: Residual deficits of mild expressive aphasia and mild dysarthria but overall improving.  She was anticipating in home PT/OT/ST but has since been completed.  She ambulates with RW without recent falls.  Lives with daughter but is able to maintain ADLs independently.  Continues Eliquis without recent bleeding or bruising - was having issues initially with gross hematuria therefore PCP decreased to 5 mg daily and daughter is slowly increasing back to BID dosing. Has not had any more additional issues with hematuria. Has chronic foley catheter due to retaining of urine. Follows  with urology regularly.  Continues atorvastatin without myalgias Blood pressure 110/48 -daughter plans on obtaining BP cuff at home She has not followed up with cardiology at this time -per daughter, PCP placed referral for cardiology but as PCP per of [facility], cardiologist will likely be located at [facility].  Daughter requesting referral within [facility] health system so they are able to see a provider within [place]. Denies new or worsening stroke/TIA  Update 10/24/2019: Jillian Guerra is a 79 year old female who is being seen today for stroke follow-up accompanied by her daughter.  She has recovered well from a stroke standpoint without residual deficits.  She endorses that her speech has returned to normal and no longer has difficulty.  Her daughter does endorse that she has had a couple falls and feels this is likely due to right knee pain secondary to arthritis.  She continues to follow with orthopedics and recently received cortisone injection with improvement but will likely need replacement surgery.  She continues to use Rollator walker with ambulation.  Continues on Eliquis and atorvastatin for secondary stroke prevention without side effects.  She has not had recent lipid panel.  She continues to follow with cardiology regularly and does have follow-up visit next week.  Blood pressure today satisfactory at 121/65.  Denies new or worsening stroke/TIA symptoms.    ROS:   14 system review of systems performed and negative with exception of joint pain  PMH:  Past Medical History:  Diagnosis Date  . Anemia   . Arrhythmia   . Arthritis   . Deep vein blood  clot of left lower extremity ([place])   . GERD (gastroesophageal reflux disease)   . Hypercholesteremia   . Hypertension   . Leukocytopenia    transient  . 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 Radiation 1998   hx of  . Uterine cancer (HCC)     PSH:  Past Surgical History:  Procedure Laterality Date  . APPENDECTOMY    . CHOLECYSTECTOMY      . CYSTOSCOPY/RETROGRADE/URETEROSCOPY Bilateral 05/08/2013   Procedure: CYSTOSCOPY/BLADDER BIOPSY/BILATERAL RETROGRADE PYELOGRAM;  Surgeon: Fredricka Bonine, MD;  Location: [facility];  Service: Urology;  Laterality: Bilateral;  left ureteroscpoy  . STAPEDECTOMY Bilateral [id number]  . TOTAL ABDOMINAL HYSTERECTOMY  1998    Social History:  Social History   Socioeconomic History  . Marital status: Divorced    Spouse name: Not on file  . Number of children: Not on file  . Years of education: Not on file  . Highest education level: Not on file  Occupational History  . Occupation: retired Copy  Tobacco Use  . Smoking status: Never Smoker  . Smokeless tobacco: Never Used  Substance and Sexual Activity  . Alcohol use: No  . Drug use: No  . Sexual activity: Not on file  Other Topics Concern  . Not on file  Social History Narrative   Lives with daughter right now but from [place]   Social Determinants of Health   Financial Resource Strain:   . Difficulty of Paying Living Expenses: Not on file  Food Insecurity:   . Worried About Charity fundraiser in the Last Year: Not on file  . Ran Out of Food in the Last Year: Not on file  Transportation Needs:   . Lack of Transportation (Medical): Not on file  . Lack of Transportation (Non-Medical): Not on file  Physical Activity:   . Days of Exercise per Week: Not on file  . Minutes of Exercise per Session: Not on file  Stress:   . Feeling of Stress : Not on file  Social Connections:   . Frequency of Communication with Friends and Family: Not on file  . Frequency of Social Gatherings with Friends and Family: Not on file  . Attends Religious Services: Not on file  . Active Member of Clubs or Organizations: Not on file  . Attends Archivist Meetings: Not on file  . Marital Status: Not on file  Intimate Partner Violence:   . Fear of Current or Ex-Partner: Not on file  . Emotionally Abused: Not on file  . Physically  Abused: Not on file  . Sexually Abused: Not on file    Family History:  Family History  Problem Relation Age of Onset  . Diabetes Sister   . Stroke Father     Medications:   Current Outpatient Medications on File Prior to Visit  Medication Sig Dispense Refill  . acetaminophen (TYLENOL) 500 MG tablet Take 1,000 mg by mouth every 6 (six) hours as needed for mild pain.     Marland Kitchen apixaban (ELIQUIS) 5 MG TABS tablet Take 1 tablet (5 mg total) by mouth 2 (two) times daily. 60 tablet 0  . atorvastatin (LIPITOR) 20 MG tablet Take 1 tablet (20 mg total) by mouth daily at 6 PM. 30 tablet 0  . Cholecalciferol 25 MCG (1000 UT) capsule Take 2,000 Units by mouth daily.    . folic acid (FOLVITE) 1 MG tablet Take 1 mg by mouth daily.    . furosemide (LASIX) 40 MG tablet  Take 60 mg by mouth daily.    . metoprolol succinate (TOPROL XL) 25 MG 24 hr tablet Take 1 tablet (25 mg total) by mouth daily. (Patient not taking: Reported on 10/24/2019) 90 tablet 3   No current facility-administered medications on file prior to visit.    Allergies:   Allergies  Allergen Reactions  . Ciprofloxacin   . Other Nausea And Vomiting  . Sulfa Antibiotics Nausea Only     Physical Exam  Vitals:   10/24/19 0953  BP: 121/65  Pulse: 65  Temp: 97.6 F (36.4 C)  Weight: 196 lb 9.6 oz (89.2 kg)  Height: 5\' 3"  (1.6 m)   Body mass index is 34.83 kg/m. No exam data present  No flowsheet data found.   General: well developed, well nourished,  pleasant elderly Caucasian female seated, in no evident distress Head: head normocephalic and atraumatic.   Neck: supple with no carotid or supraclavicular bruits Cardiovascular: regular rate and rhythm, no murmurs; chronic BLE lymphedema Musculoskeletal: no deformity Skin:  no rash/petichiae Vascular:  Normal pulses all extremities   Neurologic Exam Mental Status: Awake and fully alert.   Normal speech and language.  Oriented to place and time. Recent and remote memory  intact. Attention span, concentration and fund of knowledge appropriate. Mood and affect appropriate.  Cranial Nerves: Pupils equal, briskly reactive to light. Extraocular movements full without nystagmus. Visual fields full to confrontation. Hearing intact. Facial sensation intact. Face, tongue, palate moves normally and symmetrically.  Motor: Normal bulk and tone. Normal strength in all tested extremity muscles except mild bilateral hip flexor weakness Sensory.: intact to touch , pinprick , position and vibratory sensation.  Coordination: Rapid alternating movements normal in all extremities. Finger-to-nose performed accurately bilaterally and heel-to-shin unable to be performed due to lower extremity edema Gait and Station: Deferred as patient currently in wheelchair Reflexes: 1+ and symmetric. Toes downgoing.     Diagnostic Data (Labs, Imaging, Testing)   MR BRAIN WO CONTRAST 03/17/2019 IMPRESSION: 1. Acute nonhemorrhagic infarct in the anterior left frontal lobe. 2. Punctate cortical acute nonhemorrhagic infarct in the right parietal lobe. 3. Linear acute nonhemorrhagic infarcts involve the cerebellum bilaterally. 4. Infarcts of multiple territories suggest a central embolic source. 5. Diffuse white matter disease suggests chronic microvascular ischemia. 6. MRA circle-of-Willis demonstrates no significant proximal stenosis or occlusion. Distal disease is not evident on this study.  MR MRA HEAD  03/17/2019 IMPRESSION: 1. Acute nonhemorrhagic infarct in the anterior left frontal lobe. 2. Punctate cortical acute nonhemorrhagic infarct in the right parietal lobe. 3. Linear acute nonhemorrhagic infarcts involve the cerebellum bilaterally. 4. Infarcts of multiple territories suggest a central embolic source. 5. Diffuse white matter disease suggests chronic microvascular ischemia. 6. MRA circle-of-Willis demonstrates no significant proximal stenosis or occlusion. Distal disease     ECHOCARDIOGRAM 03/16/2019 IMPRESSIONS  1. The left ventricle has mildly reduced systolic function, with an ejection fraction of 45-50%. The cavity size was normal. There is mildly increased left ventricular wall thickness. Left ventricular diastolic Doppler parameters are consistent with  impaired relaxation.  2. The right ventricle has normal systolic function. The cavity was mildly enlarged. There is no increase in right ventricular wall thickness. Right ventricular systolic pressure is moderately elevated with an estimated pressure of 50.6 mmHg.  3. No evidence of mitral valve stenosis.  4. Pulmonary hypertension is moderate.  5. The interatrial septum was not assessed.  VAS [place] TRANSCRANIAL DOPPLER 03/16/2019 Summary: Right Carotid: Velocities in the right ICA are consistent with a  1-39% stenosis. Left Carotid: Velocities in the left ICA are consistent with a 1-39% stenosis. Vertebrals: Bilateral vertebral arteries demonstrate antegrade flow.  VAS [place] TRANSCRANIAL DOPPLER 03/16/2019 Summary: Absent bitemporal and occipital windows greatly limit exam.Normal blood flow directionsand velocities in both opthalmics and carotid siphons    ASSESSMENT: Jillian Guerra is a 79 y.o. 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 Vascular risk factors include new diagnosis of PAF, HTN, HLD.  Recovered well from stroke standpoint without residual deficits.    PLAN:  1. Embolic infarcts: Continue Eliquis (apixaban) daily  and atorvastatin for secondary stroke prevention. Maintain strict control of hypertension with blood pressure goal below 130/90, diabetes with hemoglobin A1c goal below 6.5% and cholesterol with LDL cholesterol (bad cholesterol) goal below 70 mg/dL.  I also advised the patient to eat a healthy diet with plenty of whole grains, cereals, fruits and vegetables,  exercise regularly with at least 30 minutes of continuous activity daily and maintain ideal body weight. 2. PAF: Continue to follow with cardiology for ongoing monitoring and management 3. HTN: Advised to continue current treatment regimen.  Today's BP satisfactory.  Advised to continue to monitor at home along with continued follow-up with PCP for management 4. HLD: Advised to continue current treatment regimen along with continued follow-up with PCP for future prescribing and monitoring of lipid panel.  Will obtain lipid panel today's visit but request further monitoring and prescribing of atorvastatin by PCP 5. Right knee pain: Likely secondary to arthritis and continues to follow with orthopedics.  She will continue to follow with orthopedics for ongoing monitoring and management with possible future knee replacement her daughter.  Discussion regarding possible participation in aquatic therapy or physical therapy due to recent falls and to improve hip flexor strength but daughter would like to hold off and continue to follow with orthopedics    Follow up in 6 months or call earlier if needed   Greater than 50% of time during this 20 minute visit was spent on counseling, explanation of diagnosis of embolic infarcts, reviewing risk factor management of PAF, HTN, HLD, planning of further management along with potential future management, discussion regarding right knee pain with arthritis and recent falls and discussion with patient and family answering all questions.    Frann Rider, AGNP-BC  [facility] [address] [place], [place] [address]  Phone [phone] Fax [phone] Note: This document was prepared with digital dictation and possible smart phrase technology. Any transcriptional errors that result from this process are unintentional.

## 2019-10-24 NOTE — Patient Instructions (Signed)
Continue Eliquis (apixaban) daily  and Lipitor 20 mg daily for secondary stroke prevention  Continue to follow up with PCP regarding cholesterol and blood pressure management   Check cholesterol levels at today's visit and results will be sent to your PCP and cardiologist  Continue to follow with cardiology for atrial fibrillation and Eliquis management  Continue to follow with orthopedics for ongoing knee pain  Continue to monitor blood pressure at home  Maintain strict control of hypertension with blood pressure goal below 130/90, diabetes with hemoglobin A1c goal below 6.5% and cholesterol with LDL cholesterol (bad cholesterol) goal below 70 mg/dL. I also advised the patient to eat a healthy diet with plenty of whole grains, cereals, fruits and vegetables, exercise regularly and maintain ideal body weight.  Followup in the future with me in 6 months or call earlier if needed       Thank you for coming to see [place] at [facility]. I hope we have been able to provide you high quality care today.  You may receive a patient satisfaction survey over the next few weeks. We would appreciate your feedback and comments so that we may continue to improve ourselves and the health of our patients.

## 2019-10-25 LAB — LIPID PANEL
Chol/HDL Ratio: 1.8 ratio (ref 0.0–4.4)
Cholesterol, Total: 132 mg/dL (ref 100–199)
HDL: 75 mg/dL (ref >39)
LDL Chol Calc (NIH): 44 mg/dL (ref 0–99)
Triglycerides: 63 mg/dL (ref 0–149)
VLDL Cholesterol Cal: 13 mg/dL (ref 5–40)

## 2019-10-26 ENCOUNTER — Ambulatory Visit: Payer: Medicare Other | Admitting: Cardiovascular Disease

## 2019-10-29 ENCOUNTER — Encounter: Payer: Self-pay | Admitting: Physician Assistant

## 2019-10-29 ENCOUNTER — Ambulatory Visit: Payer: Medicare Other | Admitting: Family Medicine

## 2019-10-29 ENCOUNTER — Other Ambulatory Visit: Payer: Self-pay

## 2019-10-29 VITALS — BP 129/69 | HR 72 | Temp 97.3°F | Ht 63.0 in | Wt 195.0 lb

## 2019-10-29 DIAGNOSIS — I480 Paroxysmal atrial fibrillation: Secondary | ICD-10-CM | POA: Diagnosis not present

## 2019-10-29 DIAGNOSIS — R600 Localized edema: Secondary | ICD-10-CM

## 2019-10-29 DIAGNOSIS — Z8673 Personal history of transient ischemic attack (TIA), and cerebral infarction without residual deficits: Secondary | ICD-10-CM | POA: Diagnosis not present

## 2019-10-29 DIAGNOSIS — E782 Mixed hyperlipidemia: Secondary | ICD-10-CM

## 2019-10-29 DIAGNOSIS — I10 Essential (primary) hypertension: Secondary | ICD-10-CM

## 2019-10-29 NOTE — Patient Instructions (Signed)
Medication Instructions:  Your physician recommends that you continue on your current medications as directed. Please refer to the Current Medication list given to you today.  *If you need a refill on your cardiac medications before your next appointment, please call your pharmacy*  Lab Work: NONE ordered at this time of appointment   If you have labs (blood work) drawn today and your tests are completely normal, you will receive your results only by: MyChart Message (if you have MyChart) OR A paper copy in the mail If you have any lab test that is abnormal or we need to change your treatment, we will call you to review the results.  Testing/Procedures: NONE ordered at this time of appointment   Follow-Up: At [facility], you and your health needs are our priority.  As part of our continuing mission to provide you with exceptional heart care, we have created designated Provider Care Teams.  These Care Teams include your primary Cardiologist (physician) and Advanced Practice Providers (APPs -  Physician Assistants and Nurse Practitioners) who all work together to provide you with the care you need, when you need it.  Your next appointment:   6 month(s)  The format for your next appointment:   In Person  Provider:   [place]water O'Neal, MD  Other Instructions   

## 2019-10-29 NOTE — Progress Notes (Addendum)
Cardiology Office Note  Date: 10/29/2019   ID: Jillian Guerra, DOB 12/20/1940, MRN [id number]  PCP:  Curly Rim, MD  Cardiologist:  Evalina Field, MD Electrophysiologist:  None   No chief complaint on file.   History of Present Illness: Jillian Guerra is a 79 y.o. female last encounter was Dr. Farris Has May 28, 2019.  For history of atrial fibrillation.  Other history includes CKD stage IV, secondary to obstructive uropathy, chronic urinary retention,/FSGS, endometrial cancer status post pelvic radiation, bronchiolitis obliterans, hyperlipidemia, colitis, CVA secondary to paroxysmal atrial fibrillation.  She had a recent stroke in July 2020 and was being seen for atrial fibrillation that occurred in the setting of that CVA.  She had no focal neurologic deficits from the stroke.  She has significant lower extremity edema secondary chronic venous stasis.  She presents today with no new complaints.  Denies any focal neurologic deficits such as blurred vision, slurred speech, left or right sided hemiplegia/hemiparesis.  States she has had some recent falls.  Once while bending over and attempting to stand erect.  She denied any feelings of near syncopal or syncopal episode.  She had 2 subsequent  episodes where she encountered the carpet in her house and stumbled.  She has bilateral knee arthritis and needs to have a total knee arthroplasty on the right but so far deferred surgery. Today she is sitting in a wheelchair due to difficulty with ambulation secondary to arthritis.  She denies any sensation of palpitations, arrhythmias, fluttering, heart racing, or skipped beats.  Medications reviewed with patient.  Currently taking Eliquis 5 mg p.o. twice daily, atorvastatin 20 mg daily, Lasix 60 mg twice daily 3 days/week and as needed for lower extremity edema.   She continues with bilateral lower extremity edema secondary to venous insufficiency.  Daughter who is with her states the  edema is much better now than it has been in quite some time.  Patient sees nephrology for her stage IV renal disease.  Past Medical History:  Diagnosis Date  . Anemia   . Arrhythmia   . Arthritis   . Deep vein blood clot of left lower extremity ([place])   . GERD (gastroesophageal reflux disease)   . Hypercholesteremia   . Hypertension   . Leukocytopenia    transient  . 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 Radiation 1998   hx of  . Uterine cancer [facility])     Past Surgical History:  Procedure Laterality Date  . APPENDECTOMY    . CHOLECYSTECTOMY    . CYSTOSCOPY/RETROGRADE/URETEROSCOPY Bilateral 05/08/2013   Procedure: CYSTOSCOPY/BLADDER BIOPSY/BILATERAL RETROGRADE PYELOGRAM;  Surgeon: Fredricka Bonine, MD;  Location: [facility];  Service: Urology;  Laterality: Bilateral;  left ureteroscpoy  . STAPEDECTOMY Bilateral [id number]  . TOTAL ABDOMINAL HYSTERECTOMY  1998    Current Outpatient Medications  Medication Sig Dispense Refill  . acetaminophen (TYLENOL) 500 MG tablet Take 1,000 mg by mouth every 6 (six) hours as needed for mild pain.     Marland Kitchen apixaban (ELIQUIS) 5 MG TABS tablet Take 1 tablet (5 mg total) by mouth 2 (two) times daily. 60 tablet 0  . atorvastatin (LIPITOR) 20 MG tablet Take 1 tablet (20 mg total) by mouth daily at 6 PM. 30 tablet 0  . Cholecalciferol 25 MCG (1000 UT) capsule Take 2,000 Units by mouth daily.    . folic acid (FOLVITE) 1 MG tablet Take 1 mg by mouth daily.    . furosemide (LASIX) 40 MG  tablet Take 60 mg by mouth daily.     No current facility-administered medications for this visit.   Allergies:  Ciprofloxacin, Other, and Sulfa antibiotics   Social History: The patient  reports that she has never smoked. She has never used smokeless tobacco. She reports that she does not drink alcohol or use drugs.   Family History: The patient's family history includes Diabetes in her sister; Stroke in her father.   ROS:  Please see the history of present illness. Otherwise,  complete review of systems is positive for none.  All other systems are reviewed and negative.   Physical Exam: VS:  BP 129/69   Pulse 72   Temp (!) 97.3 F (36.3 C)   Ht 5\' 3"  (1.6 m)   Wt 195 lb (88.5 kg)   SpO2 98%   BMI 34.54 kg/m , BMI Body mass index is 34.54 kg/m.  Wt Readings from Last 3 Encounters:  10/29/19 195 lb (88.5 kg)  10/24/19 196 lb 9.6 oz (89.2 kg)  05/28/19 201 lb 12.8 oz (91.5 kg)    General: Patient appears comfortable at rest. Neck: Supple, no elevated JVP or carotid bruits, no thyromegaly. Lungs: Clear to auscultation, nonlabored breathing at rest. Cardiac: Regular rate and rhythm, no S3 or significant systolic murmur, no pericardial rub. Abdomen: Soft, nontender, no hepatomegaly, bowel sounds present, no guarding or rebound. Extremities: Patient has chronic lower extremity mildly pitting edema secondary to chronic venous   edema, distal pulses 2+. Skin: Warm and dry. Neuropsychiatric: Alert and oriented x3, affect grossly appropriate.  ECG:  An ECG dated May 29, 2019 was personally reviewed today and demonstrated:  Sinus rhythm with sinus arrhythmia, rate of 85  Recent Labwork: 03/15/2019: ALT 14; AST 19; BUN 50; Creatinine, Ser 2.30; Hemoglobin 9.2; Platelets 231; Potassium 4.2; Sodium 138     Component Value Date/Time   CHOL 132 10/24/2019 1026   TRIG 63 10/24/2019 1026   HDL 75 10/24/2019 1026   CHOLHDL 1.8 10/24/2019 1026   CHOLHDL 2.0 03/16/2019 0528   VLDL 12 03/16/2019 0528   LDLCALC 44 10/24/2019 1026    Other Studies Reviewed Today: MRI of the head without contrast March 17, 2019.  IMPRESSION: 1. Acute nonhemorrhagic infarct in the anterior left frontal lobe. 2. Punctate cortical acute nonhemorrhagic infarct in the right parietal lobe. 3. Linear acute nonhemorrhagic infarcts involve the cerebellum bilaterally. 4. Infarcts of multiple territories suggest a central embolic source. 5. Diffuse white matter disease suggests chronic  microvascular ischemia. 6. MRA circle-of-Willis demonstrates no significant proximal stenosis or occlusion. Distal disease is not evident on this study.  Carotid artery duplex study March 16, 2019  Summary: Right Carotid: Velocities in the right ICA are consistent with a 1-39% stenosis. Left Carotid: Velocities in the left ICA are consistent with a 1-39% stenosis. Vertebrals: Bilateral vertebral arteries demonstrate antegrade flow.   Echocardiogram March 16, 2019   IMPRESSIONS   1. The left ventricle has mildly reduced systolic function, with an ejection fraction of 45-50%. The cavity size was normal. There is mildly increased left ventricular wall thickness. Left ventricular diastolic Doppler parameters are consistent with  impaired relaxation.  2. The right ventricle has normal systolic function. The cavity was mildly enlarged. There is no increase in right ventricular wall thickness. Right ventricular systolic pressure is moderately elevated with an estimated pressure of 50.6 mmHg.  3. No evidence of mitral valve stenosis.  4. Pulmonary hypertension is moderate.  5. The interatrial septum was not assessed.  Assessment and Plan:  1. Paroxysmal atrial fibrillation (HCC)   2. Essential hypertension   3. Mixed hyperlipidemia   4. History of CVA (cerebrovascular accident) without residual deficits    1. Paroxysmal atrial fibrillation (HCC) Heart rate and rhythm are regular.  Pulse 72.  Continue Eliquis 5 mg p.o. twice daily    2. Essential hypertension Blood pressure within normal limits today.  Continue to monitor  3. Mixed hyperlipidemia Continue atorvastatin 20 mg daily.  Recent lipid total cholesterol 132, triglycerides 63, HDL 75, VLDL 13, LDL 44.  4. History of CVA (cerebrovascular accident) without residual deficits Patient has no focal neurologic deficits.  She is using a wheelchair due to limited mobility secondary to arthritis bilaterally in both knees.   Daughter states patient needs a right knee replacement surgery.  Patient denies any recent blurred vision, slurred speech, left or right sided hemiparesis/hemiplegia.  Continue Eliquis 5 mg p.o. twice daily.   Medication Adjustments/Labs and Tests Ordered: Current medicines are reviewed at length with the patient today.  Concerns regarding medicines are outlined above.    Patient Instructions  Medication Instructions:  Your physician recommends that you continue on your current medications as directed. Please refer to the Current Medication list given to you today.  *If you need a refill on your cardiac medications before your next appointment, please call your pharmacy*  Lab Work: NONE ordered at this time of appointment   If you have labs (blood work) drawn today and your tests are completely normal, you will receive your results only by: Marland Kitchen MyChart Message (if you have MyChart) OR . A paper copy in the mail If you have any lab test that is abnormal or we need to change your treatment, we will call you to review the results.  Testing/Procedures: NONE ordered at this time of appointment   Follow-Up: At [facility], you and your health needs are our priority.  As part of our continuing mission to provide you with exceptional heart care, we have created designated Provider Care Teams.  These Care Teams include your primary Cardiologist (physician) and Advanced Practice Providers (APPs -  Physician Assistants and Nurse Practitioners) who all work together to provide you with the care you need, when you need it.  Your next appointment:   6 month(s)  The format for your next appointment:   In Person  Provider:   Eleonore Chiquito, MD  Other Instructions          Signed, Levell July, NP 10/29/2019 4:48 PM    [facility] [place], [place], [place] [address] Phone: [phone]; Fax: [phone]

## 2019-12-24 ENCOUNTER — Telehealth: Payer: Self-pay | Admitting: Adult Health

## 2019-12-24 NOTE — Telephone Encounter (Signed)
Pt's daughter called to inform us that the pt has passed away. Future appt has been canceled.

## 2019-12-24 NOTE — Telephone Encounter (Signed)
Thank you for letting me know.  Can we please send daughter and family a condolence card?  Thank you.

## 2020-01-03 DEATH — deceased

## 2020-04-22 ENCOUNTER — Ambulatory Visit: Payer: Medicare Other | Admitting: Adult Health

## 2020-06-24 IMAGING — MR MRI HEAD WITHOUT CONTRAST
1 series · 23 of 48 positions shown · non-contrast
Comparison: None.

CLINICAL DATA: Stroke, follow-up.

EXAM:
MRI HEAD WITHOUT CONTRAST
MRA HEAD WITHOUT CONTRAST
TECHNIQUE: Multiplanar, multiecho pulse sequences of the brain and surrounding
structures were obtained without intravenous contrast. Angiographic
images of the head were obtained using MRA technique without
contrast.

[Series 9: 3d cow · axial · 0.5mm · 0.41mm/px · z∈[-61,+51]mm · 23 of 240 slices shown]
[im 1/240]
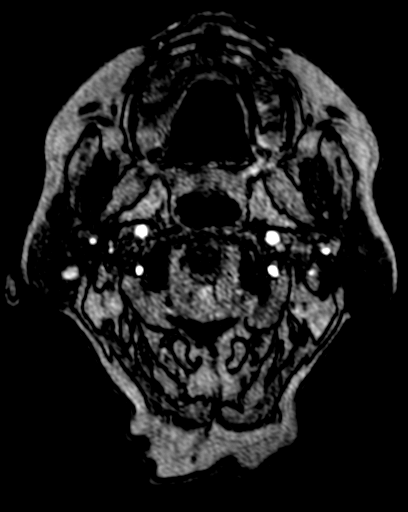
[im 6/240]
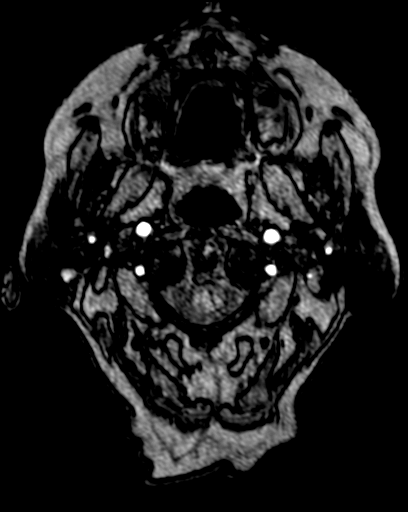
[im 11/240]
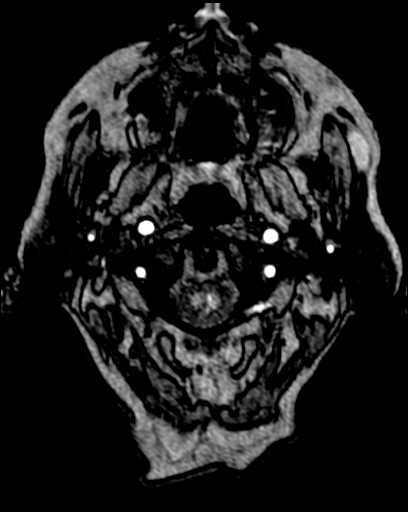
[im 16/240]
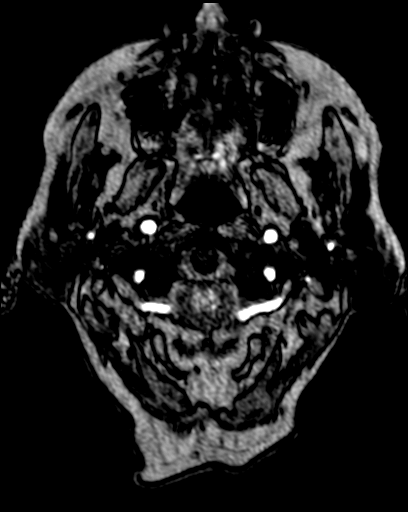
[im 21/240]
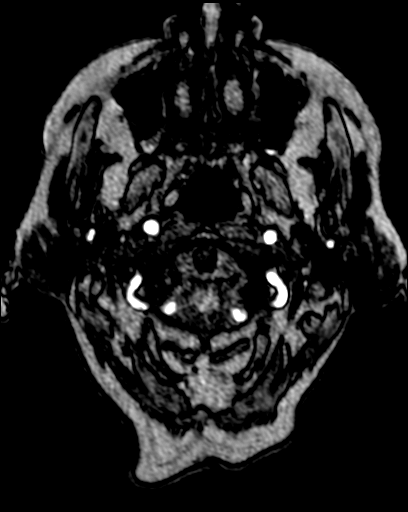
[im 26/240]
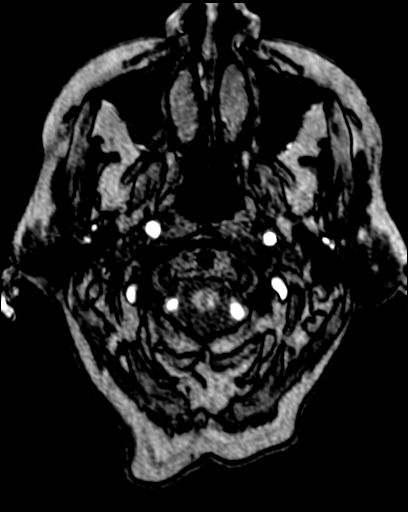
[im 31/240]
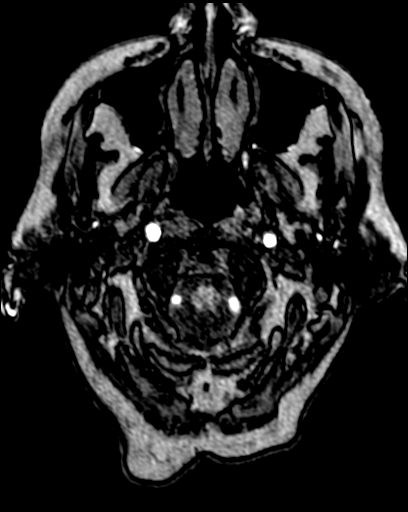
[im 36/240]
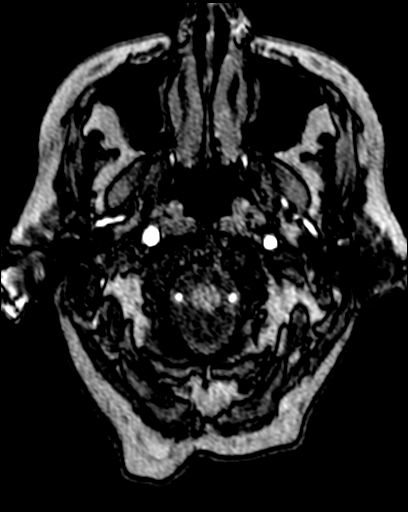
[im 41/240]
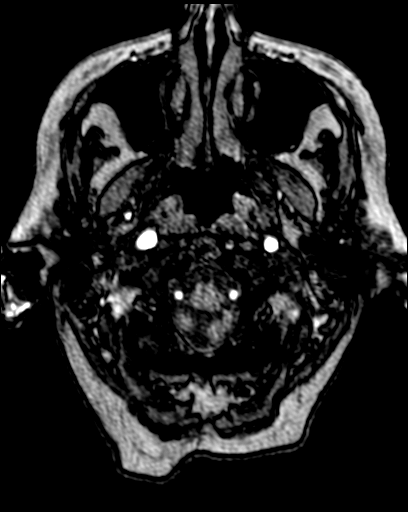
[im 46/240]
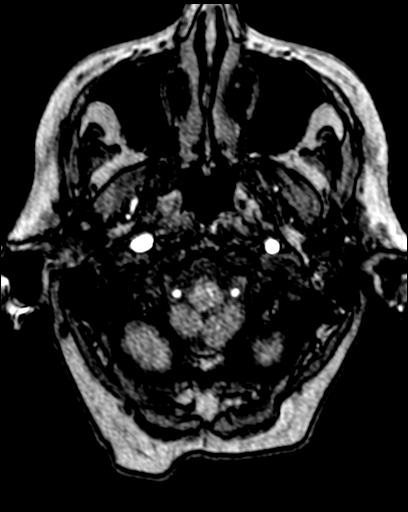
[im 51/240]
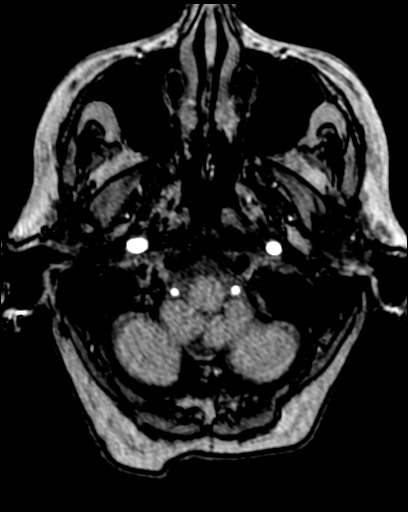
[im 56/240]
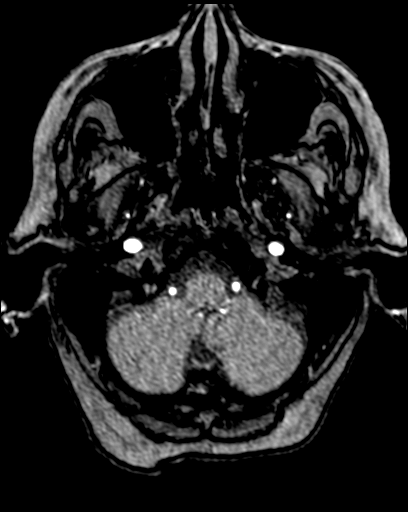
[im 62/240]
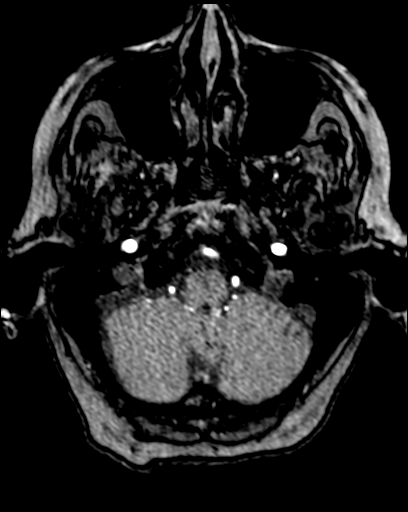
[im 67/240]
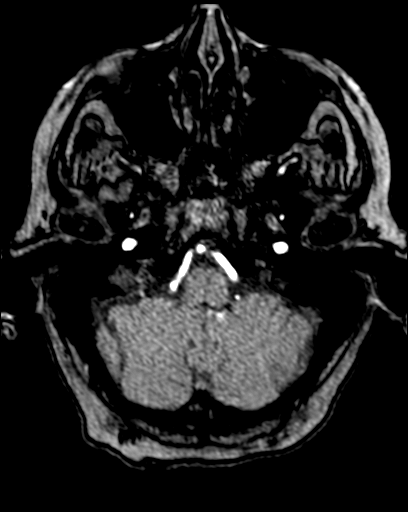
[im 72/240]
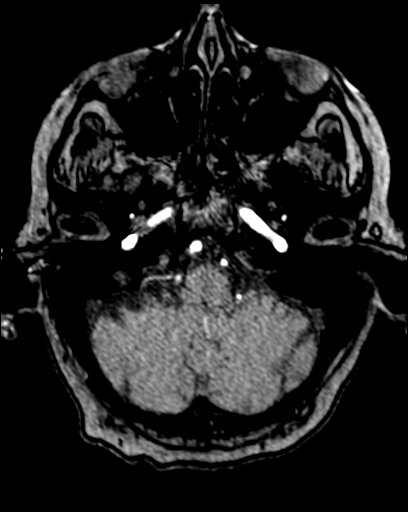
[im 77/240]
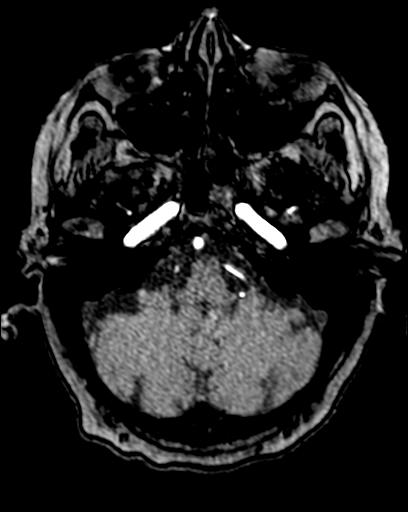
[im 107/240]
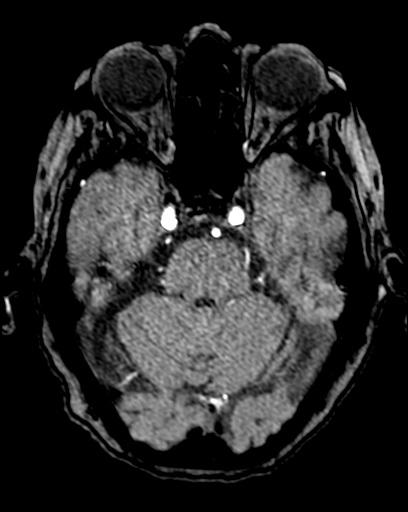
[im 123/240]
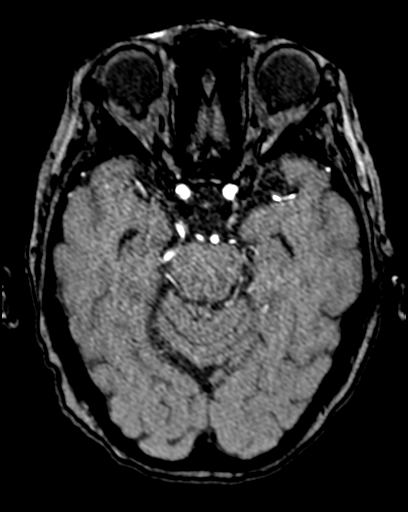
[im 138/240]
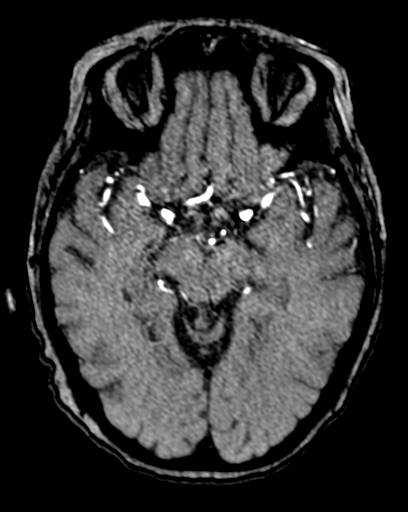
[im 168/240]
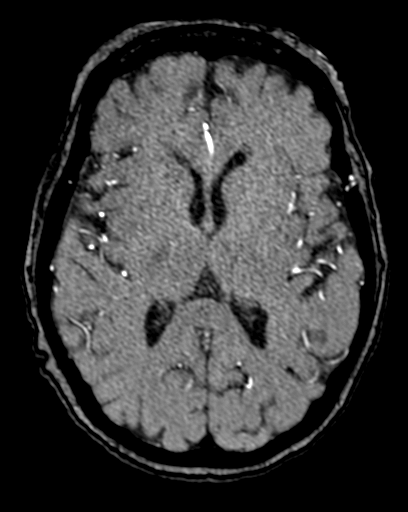
[im 199/240]
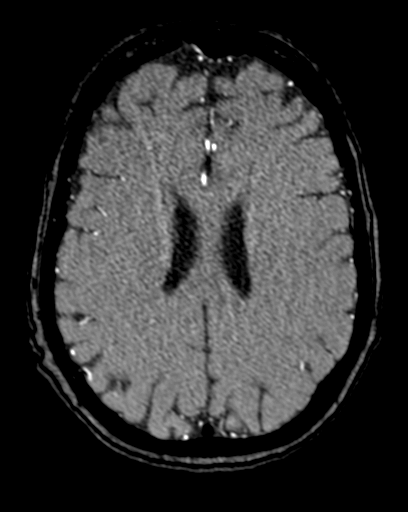
[im 204/240]
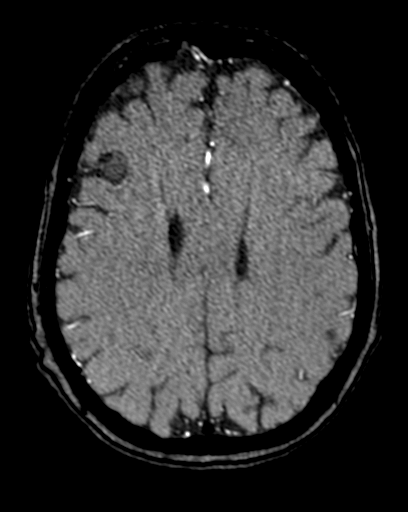
[im 229/240]
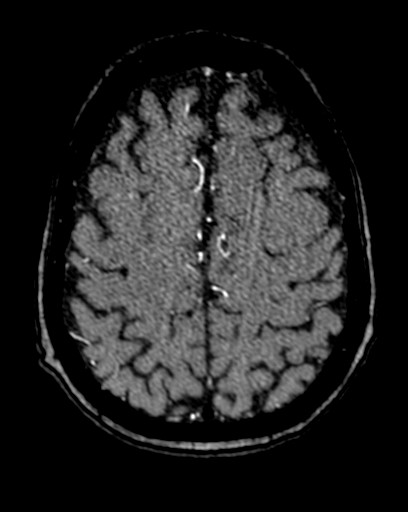

[23 of 48 positions shown; findings below may reference images not displayed]

FINDINGS: MRI HEAD FINDINGS

Brain: By request of the neurologist, only diffusion-weighted images
were performed.

Acute nonhemorrhagic infarct is present in the anterior left frontal
lobe measuring up to 3 cm. A punctate infarct is present in the
right parietal lobe cortex on image 88 of series 5. Bilateral linear
cerebellar infarcts are present.

Moderate periventricular and subcortical white matter changes are
present bilaterally. Matter changes extend into the corona radiata
bilaterally. Diffuse white matter changes are also present in the
right and stem

Vascular: Unable to assess on diffusion weighted imaging.

Skull and upper cervical spine: Skull base is within normal limits
on diffusion-weighted imaging.

Sinuses/Orbits: No definite sinus disease. Orbits cannot be assessed
on diffusion only imaging.

Other:

MRA HEAD FINDINGS

The internal carotid arteries are within normal limits from the high
cervical segments through the ICA termini. The A1 and M1 segments
are normal. The anterior communicating artery is patent. ACA and MCA
branch vessels are within normal limits.

The vertebral arteries are codominant. Left PICA origin is
visualized and normal. The right AICA is dominant. The basilar
artery is normal. The left posterior cerebral artery is fed by
similar-sized left posterior communicating arteries and a P1
segment. A fetal type right posterior cerebral artery is present. A
small right P1 segment is noted. PCA branch vessels are within
normal limits bilaterally.
IMPRESSION: 1. Acute nonhemorrhagic infarct in the anterior left frontal lobe.
2. Punctate cortical acute nonhemorrhagic infarct in the right
parietal lobe.
3. Linear acute nonhemorrhagic infarcts involve the cerebellum
bilaterally.
4. Infarcts of multiple territories suggest a central embolic
source.
5. Diffuse white matter disease suggests chronic microvascular
ischemia.
6. MRA circle-of-Willis demonstrates no significant proximal
stenosis or occlusion. Distal disease is not evident on this study.

## 2022-09-03 DEATH — deceased
